# Patient Record
Sex: Male | Born: 1965 | Race: Black or African American | Hispanic: No | Smoking: Former smoker
Health system: Southern US, Community
[De-identification: ages and names within clinical notes are randomized; demographics above are authoritative.]

## PROBLEM LIST (undated history)

## (undated) DIAGNOSIS — G709 Myoneural disorder, unspecified: Secondary | ICD-10-CM

## (undated) DIAGNOSIS — L97529 Non-pressure chronic ulcer of other part of left foot with unspecified severity: Secondary | ICD-10-CM

## (undated) DIAGNOSIS — E11621 Type 2 diabetes mellitus with foot ulcer: Secondary | ICD-10-CM

## (undated) DIAGNOSIS — I10 Essential (primary) hypertension: Secondary | ICD-10-CM

## (undated) HISTORY — DX: Type 2 diabetes mellitus with foot ulcer: E11.621

## (undated) HISTORY — PX: TONSILLECTOMY: SUR1361

## (undated) HISTORY — DX: Essential (primary) hypertension: I10

## (undated) HISTORY — PX: HAND SURGERY: SHX662

## (undated) HISTORY — DX: Non-pressure chronic ulcer of other part of left foot with unspecified severity: L97.529

## (undated) HISTORY — DX: Myoneural disorder, unspecified: G70.9

---

## 2017-05-04 ENCOUNTER — Emergency Department (INDEPENDENT_AMBULATORY_CARE_PROVIDER_SITE_OTHER): Payer: 59

## 2017-05-04 ENCOUNTER — Encounter: Payer: Self-pay | Admitting: Podiatry

## 2017-05-04 ENCOUNTER — Telehealth: Payer: Self-pay | Admitting: Sports Medicine

## 2017-05-04 ENCOUNTER — Ambulatory Visit (INDEPENDENT_AMBULATORY_CARE_PROVIDER_SITE_OTHER): Payer: 59 | Admitting: Podiatry

## 2017-05-04 ENCOUNTER — Emergency Department
Admission: EM | Admit: 2017-05-04 | Discharge: 2017-05-04 | Disposition: A | Discharge status: Home / Self Care | Payer: 59 | Source: Home / Self Care | Attending: Family Medicine | Admitting: Family Medicine

## 2017-05-04 ENCOUNTER — Encounter: Payer: Self-pay | Admitting: *Deleted

## 2017-05-04 ENCOUNTER — Ambulatory Visit (INDEPENDENT_AMBULATORY_CARE_PROVIDER_SITE_OTHER): Payer: 59

## 2017-05-04 VITALS — BP 155/71 | HR 82 | Ht 72.0 in | Wt 270.0 lb

## 2017-05-04 DIAGNOSIS — L97509 Non-pressure chronic ulcer of other part of unspecified foot with unspecified severity: Secondary | ICD-10-CM

## 2017-05-04 DIAGNOSIS — L02612 Cutaneous abscess of left foot: Secondary | ICD-10-CM

## 2017-05-04 DIAGNOSIS — L03032 Cellulitis of left toe: Secondary | ICD-10-CM | POA: Diagnosis not present

## 2017-05-04 DIAGNOSIS — E11621 Type 2 diabetes mellitus with foot ulcer: Secondary | ICD-10-CM | POA: Diagnosis not present

## 2017-05-04 DIAGNOSIS — R739 Hyperglycemia, unspecified: Secondary | ICD-10-CM

## 2017-05-04 DIAGNOSIS — S91132A Puncture wound without foreign body of left great toe without damage to nail, initial encounter: Secondary | ICD-10-CM | POA: Diagnosis not present

## 2017-05-04 DIAGNOSIS — M79675 Pain in left toe(s): Secondary | ICD-10-CM

## 2017-05-04 DIAGNOSIS — L03116 Cellulitis of left lower limb: Secondary | ICD-10-CM

## 2017-05-04 DIAGNOSIS — L97529 Non-pressure chronic ulcer of other part of left foot with unspecified severity: Secondary | ICD-10-CM | POA: Diagnosis not present

## 2017-05-04 DIAGNOSIS — L97519 Non-pressure chronic ulcer of other part of right foot with unspecified severity: Secondary | ICD-10-CM | POA: Diagnosis not present

## 2017-05-04 LAB — POCT CBC W AUTO DIFF (K'VILLE URGENT CARE)

## 2017-05-04 LAB — POCT FASTING CBG KUC MANUAL ENTRY: POCT GLUCOSE (MANUAL ENTRY) KUC: 443 mg/dL — AB (ref 70–99)

## 2017-05-04 LAB — SEDIMENTATION RATE: SED RATE: 118 mm/h — AB (ref 0–20)

## 2017-05-04 MED ORDER — KETOROLAC TROMETHAMINE 60 MG/2ML IJ SOLN
60.0000 mg | Freq: Once | INTRAMUSCULAR | Status: AC
  Administered 2017-05-04: 60 mg via INTRAMUSCULAR

## 2017-05-04 MED ORDER — AMOXICILLIN-POT CLAVULANATE 875-125 MG PO TABS: 1.0000 | ORAL_TABLET | Freq: Two times a day (BID) | ORAL | 1 refills | Status: DC

## 2017-05-04 NOTE — Assessment & Plan Note (Signed)
Toe ulcer for sometime, plantar aspect, I able to probe all the way to the bone making this consistent with osteomyelitis. He has no history of diabetes but a spot blood sugar today was over 400. MRI of the foot, referral to podiatry, I do suspect he is going to need a medication. He also needs to find a primary care doctor for control of his diabetes.

## 2017-05-04 NOTE — Telephone Encounter (Signed)
After telling the patient multiple times to answer his phone when I call, I was forced to leave message letting him know that I have made him appointments with our podiatrist downstairs at [address], Dr. Raynald KempSheard. He also has an MRI appointment at 4:30 also downstairs.  During my evaluation he agreed to establish with one of our primary care providers for further control of his newly diagnosed diabetes.

## 2017-05-04 NOTE — ED Triage Notes (Signed)
Pt c/o infected LT great toe x 1 wk. Denies fever; he has felt "hot" at times.

## 2017-05-04 NOTE — Progress Notes (Signed)
SUBJECTIVE: 51 y.o. year old male presents referred by PCP for infected left great toe. Stated that he noted pain in the big toe on 04/30/17. Since then he noted a hole on bottom of the toe and more pain and swelling each day. He was seen by Dr. Cathren HarshBeese this morning and was referred to this office this afternoon. He is scheduled to have MRI of the foot  After this office visit today.  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  He was seen by Dr. Cathren HarshBeese prior to coming to this office. It was noted that this morning Blood glucose was 443  this morning at Dr. Cathren HarshBeese office.  Stated that the last time he was seen by a physician was about 2 years ago. He did not know he was diabetic.   REVIEW OF SYSTEMS: Pertinent items noted in HPI and remainder of comprehensive ROS otherwise negative.  OBJECTIVE: DERMATOLOGIC EXAMINATION: Red swollen entire left great toe limited distally to the metatarsophalangeal joint.  Noted of no proximal expansion of cellulitis beyond the first MPJ.  The lesion is about.8cm diameter round opening with deep penetration with serosanguinous wet red granular base and minimum drainage under the left great toe just distal to IPJ sulcus.  Base of opening is red without necrotic change.  VASCULAR EXAMINATION OF LOWER LIMBS: All pedal pulses are palpable with normal pulsation.  Edema left foot with erythema distal to the first MPJ left great toe.  NEUROLOGIC EXAMINATION OF THE LOWER LIMBS: All epicritic and tactile sensations grossly intact. Sharp and Dull discriminatory sensations at the plantar ball of hallux is intact bilateral.   MUSCULOSKELETAL EXAMINATION: No gross deformities noted.  ASSESSMENT: 1. Possible Deep puncture wound plantar left great toe with 0.8 cm opening. 2. Cellulitis localized to the left great toe. 3. Rule out Osteomyelitis left great toe.     The plantar opening of the wound may allow fluid to  drain and prevent further proximal advancement of Cellulitis.  4. Uncontrolled diabetic. 5. Possible Diabetic Neuropathy.   PLAN: Reviewed findings and available treatment options. While waiting on MRI report will put on Augmentin 875 bid. Will order antibiotic foot soak with [facility]. Will see him next week.

## 2017-05-04 NOTE — Consult Note (Addendum)
   Subjective:    I'm seeing this patient as a consultation for:  Dr. Donna ChristenStephen Beese  CC: Toe ulcer  HPI: This is a pleasant 51 year old male, describes no past medical history, he tells me for the past couple of weeks he's noticed some swelling of his toe, he looked underneath and found an ulcer. He really didn't feel any pain. Symptoms are severe, worsening.  Past medical history:  Negative.  See flowsheet/record as well for more information.  Surgical history: Negative.  See flowsheet/record as well for more information.  Family history: Negative.  See flowsheet/record as well for more information.  Social history: Negative.  See flowsheet/record as well for more information.  Allergies, and medications have been entered into the medical record, reviewed, and no changes needed.   Review of Systems: No headache, visual changes, nausea, vomiting, diarrhea, constipation, dizziness, abdominal pain, skin rash, fevers, chills, night sweats, weight loss, swollen lymph nodes, body aches, joint swelling, muscle aches, chest pain, shortness of breath, mood changes, visual or auditory hallucinations.   Objective:   General: Well Developed, well nourished, and in no acute distress.  Neuro/Psych: Alert and oriented x3, extra-ocular muscles intact, able to move all 4 extremities, sensation grossly intact. Skin: Warm and dry, no rashes noted.  Respiratory: Not using accessory muscles, speaking in full sentences, trachea midline.  Cardiovascular: Pulses palpable, no extremity edema. Abdomen: Does not appear distended. Left great toe:  Swollen, erythematous, there is what appears to be diabetic foot ulcer, I'm able to probe it all the way to the bone.  Blood sugar is over 400.  X-ray show degenerative changes but no evidence of osteomyelitis.  Impression and Recommendations:   This case required medical decision making of moderate complexity.  Diabetic foot ulcer (HCC) Toe ulcer for sometime,  plantar aspect, I able to probe all the way to the bone making this consistent with osteomyelitis. He has no history of diabetes but a spot blood sugar today was over 400. MRI of the foot, referral to podiatry, I do suspect he is going to need a medication. He also needs to find a primary care doctor for control of his diabetes.

## 2017-05-04 NOTE — Patient Instructions (Addendum)
Seen for acute infected ulcer left great toe. Ordered Augmentin 875 to take one every 12 hours. Also will order antibiotic foot soak daily treatment. Return in one week.

## 2017-05-04 NOTE — ED Provider Notes (Signed)
[name]    CSN: [id number] Arrival date & time: 05/04/17  0954     History   Chief Complaint Chief Complaint  Patient presents with  . Toe Pain  . Wound Infection    HPI Russell Merritt is a 51 y.o. male.   Patient noticed a "hole" on the plantar surface of his left great toe one week ago, and does not know how long it had been present.  During the past week he has developed gradually increasing redness, heat, and pain/swelling in his left great toe, especially worse during the past 48 hours.  He has felt increasingly fatigued with chills/sweats and decreased appetite.  He complains of pain in his proximal left foot which does not extend to his left lower leg.  He denies history of diabetes.   The history is provided by the patient.  Toe Pain  This is a new problem. The current episode started more than 1 week ago. The problem occurs constantly. The problem has been rapidly worsening. Pertinent negatives include no chest pain and no shortness of breath. The symptoms are aggravated by walking. Nothing relieves the symptoms. Treatments tried: topical ointment. The treatment provided no relief.    History reviewed. No pertinent past medical history.  Patient Active Problem List   Diagnosis Date Noted  . Diabetic foot ulcer (HCC) 05/04/2017    Past Surgical History:  Procedure Laterality Date  . HAND SURGERY Left   . TONSILLECTOMY         Home Medications    Prior to Admission medications   Medication Sig Start Date End Date Taking? Authorizing Provider  ibuprofen (ADVIL,MOTRIN) 200 MG tablet Take 200 mg by mouth every 6 (six) hours as needed.   Yes [provider]  MULTIPLE VITAMIN PO Take by mouth.   Yes [provider]    Family History Family History  Problem Relation Age of Onset  . Family history unknown: Yes    Social History Social History  Substance Use Topics  . Smoking status: Former Games developer  . Smokeless tobacco:  Never Used  . Alcohol use No     Allergies   Patient has no known allergies.   Review of Systems Review of Systems  Respiratory: Negative for shortness of breath.   Cardiovascular: Negative for chest pain.     Physical Exam Triage Vital Signs ED Triage Vitals  Enc Vitals Group     BP 05/04/17 1023 (!) 145/80     Pulse Rate 05/04/17 1023 86     Resp 05/04/17 1023 (!) 24     Temp 05/04/17 1023 99.1 F (37.3 C)     Temp Source 05/04/17 1023 Oral     SpO2 05/04/17 1023 96 %     Weight 05/04/17 1024 269 lb (122 kg)     Height 05/04/17 1024 6' (1.829 m)     Head Circumference --      Peak Flow --      Pain Score 05/04/17 1024 10     Pain Loc --      Pain Edu? --      Excl. in GC? --    No data found.   Updated Vital Signs BP (!) 145/80 (BP Location: Left Arm)   Pulse 86   Temp 99.1 F (37.3 C) (Oral)   Resp (!) 24   Ht 6' (1.829 m)   Wt 269 lb (122 kg)   SpO2 96%   BMI 36.48 kg/m  Visual Acuity Right Eye Distance:   Left Eye Distance:   Bilateral Distance:    Right Eye Near:   Left Eye Near:    Bilateral Near:     Physical Exam  Constitutional: He appears well-developed and well-nourished. No distress.  HENT:  Head: Normocephalic.  Right Ear: External ear normal.  Left Ear: External ear normal.  Nose: Nose normal.  Mouth/Throat: Oropharynx is clear and moist.  Eyes: Conjunctivae are normal. Pupils are equal, round, and reactive to light.  Neck: Neck supple.  Cardiovascular: Normal heart sounds.   Pulmonary/Chest: Breath sounds normal.  Musculoskeletal: He exhibits no edema.       Feet:  Left great toe is diffusely swollen, tender to palpation, erythematous, and warm to touch.  Cap refill present.  On the plantar surface of the first toe is a chronic appearing ulcer.  Tenderness and warmth extent to proximal foot, but not above ankle.  Pedal pulses are intact.  Lymphadenopathy:    He has no cervical adenopathy.  Neurological: He is alert.    Skin: Skin is warm and dry.  Nursing note and vitals reviewed.    UC Treatments / Results  Labs (all labs ordered are listed, but only abnormal results are displayed) Labs Reviewed  POCT FASTING CBG [facility] MANUAL ENTRY - Abnormal; Notable for the following:       Result Value   POCT Glucose ([facility]) 443 (*)    All other components within normal limits  SEDIMENTATION RATE  POCT CBC W AUTO DIFF ([facility])    EKG  EKG Interpretation None       Radiology Dg Foot Complete Left  Result Date: 05/04/2017 CLINICAL DATA:  Drainage, pain, and swelling associated with a defect in the skin along the ventral aspect of the first toe. EXAM: LEFT FOOT - COMPLETE 3+ VIEW COMPARISON:  None in PACs FINDINGS: There is diffuse swelling of the great toe. There is soft tissue gas both proximally and distally in the toe. None extends into the metatarsal region. No definite bony changes are seen suspicious for osteomyelitis. There is no acute fracture. There is mild degenerative change of the first metatarsophalangeal joint. The other phalanges appear normal. The metatarsals are intact. There are mild degenerative changes of the intertarsal joints. There are plantar and Achilles region calcaneal spurs. There is calcification within the plantar fascia. IMPRESSION: Findings consistent with cellulitis of the great toe with soft tissue gas collections. No objective evidence of osteomyelitis. 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 Electronically Signed   By: David  SwazilandJordan M.D.   On: 05/04/2017 11:43    Procedures Procedures (including critical care time)  Medications Ordered in UC Medications  ketorolac (TORADOL) injection 60 mg (60 mg Intramuscular Given 05/04/17 1115)  POCT CBG 443   Initial Impression / Assessment and Plan / UC Course  I have reviewed the triage vital signs and the nursing notes.  Pertinent labs & imaging results that were available during my care of the patient were  reviewed by me and considered in my medical decision making (see chart for details).    Suspect osteomyelitis.  Suspect peripheral neuropathy resulting from undiagnosed Type 2 diabetes. Will refer to Dr. Rodney Langtonhomas Thekkekandam for management and further evaluation.  Final Clinical Impressions(s) / UC Diagnoses   Final diagnoses:  Cellulitis of great toe, left  Cellulitis of left foot  Hyperglycemia    New Prescriptions Discharge Medication List as of 05/04/2017 12:30 PM       Geet Hosking,  Tera Mater, MD 05/04/17 [phone]

## 2017-05-05 ENCOUNTER — Inpatient Hospital Stay (HOSPITAL_COMMUNITY)
Admission: EM | Admit: 2017-05-05 | Discharge: 2017-05-09 | DRG: 616 | Disposition: A | Discharge status: Home Health | Payer: 59 | Attending: Internal Medicine | Admitting: Internal Medicine

## 2017-05-05 ENCOUNTER — Ambulatory Visit (INDEPENDENT_AMBULATORY_CARE_PROVIDER_SITE_OTHER): Payer: 59 | Admitting: Podiatry

## 2017-05-05 ENCOUNTER — Encounter (HOSPITAL_COMMUNITY): Payer: Self-pay | Admitting: Emergency Medicine

## 2017-05-05 DIAGNOSIS — L97529 Non-pressure chronic ulcer of other part of left foot with unspecified severity: Secondary | ICD-10-CM | POA: Diagnosis present

## 2017-05-05 DIAGNOSIS — L02612 Cutaneous abscess of left foot: Secondary | ICD-10-CM | POA: Diagnosis present

## 2017-05-05 DIAGNOSIS — L03032 Cellulitis of left toe: Secondary | ICD-10-CM

## 2017-05-05 DIAGNOSIS — M869 Osteomyelitis, unspecified: Secondary | ICD-10-CM | POA: Diagnosis present

## 2017-05-05 DIAGNOSIS — A480 Gas gangrene: Secondary | ICD-10-CM | POA: Diagnosis present

## 2017-05-05 DIAGNOSIS — L97509 Non-pressure chronic ulcer of other part of unspecified foot with unspecified severity: Secondary | ICD-10-CM

## 2017-05-05 DIAGNOSIS — E861 Hypovolemia: Secondary | ICD-10-CM | POA: Diagnosis present

## 2017-05-05 DIAGNOSIS — E1140 Type 2 diabetes mellitus with diabetic neuropathy, unspecified: Secondary | ICD-10-CM | POA: Diagnosis present

## 2017-05-05 DIAGNOSIS — IMO0002 Reserved for concepts with insufficient information to code with codable children: Secondary | ICD-10-CM

## 2017-05-05 DIAGNOSIS — E119 Type 2 diabetes mellitus without complications: Secondary | ICD-10-CM

## 2017-05-05 DIAGNOSIS — E11628 Type 2 diabetes mellitus with other skin complications: Secondary | ICD-10-CM | POA: Diagnosis present

## 2017-05-05 DIAGNOSIS — E11621 Type 2 diabetes mellitus with foot ulcer: Secondary | ICD-10-CM | POA: Diagnosis present

## 2017-05-05 DIAGNOSIS — E1169 Type 2 diabetes mellitus with other specified complication: Secondary | ICD-10-CM | POA: Diagnosis present

## 2017-05-05 DIAGNOSIS — M86172 Other acute osteomyelitis, left ankle and foot: Secondary | ICD-10-CM

## 2017-05-05 DIAGNOSIS — L03119 Cellulitis of unspecified part of limb: Secondary | ICD-10-CM

## 2017-05-05 DIAGNOSIS — E118 Type 2 diabetes mellitus with unspecified complications: Secondary | ICD-10-CM

## 2017-05-05 DIAGNOSIS — M86272 Subacute osteomyelitis, left ankle and foot: Secondary | ICD-10-CM | POA: Diagnosis not present

## 2017-05-05 DIAGNOSIS — E669 Obesity, unspecified: Secondary | ICD-10-CM | POA: Diagnosis present

## 2017-05-05 DIAGNOSIS — E1165 Type 2 diabetes mellitus with hyperglycemia: Secondary | ICD-10-CM | POA: Diagnosis present

## 2017-05-05 DIAGNOSIS — Z87891 Personal history of nicotine dependence: Secondary | ICD-10-CM | POA: Diagnosis not present

## 2017-05-05 DIAGNOSIS — Z6836 Body mass index (BMI) 36.0-36.9, adult: Secondary | ICD-10-CM | POA: Diagnosis not present

## 2017-05-05 DIAGNOSIS — E871 Hypo-osmolality and hyponatremia: Secondary | ICD-10-CM | POA: Diagnosis present

## 2017-05-05 LAB — GLUCOSE, CAPILLARY: GLUCOSE-CAPILLARY: 214 mg/dL — AB (ref 65–99)

## 2017-05-05 LAB — CBC WITH DIFFERENTIAL/PLATELET
Basophils Absolute: 0 10*3/uL (ref 0.0–0.1)
Basophils Relative: 0 %
EOS ABS: 0.2 10*3/uL (ref 0.0–0.7)
Eosinophils Relative: 1 %
HCT: 40.3 % (ref 39.0–52.0)
HEMOGLOBIN: 14.4 g/dL (ref 13.0–17.0)
LYMPHS PCT: 20 %
Lymphs Abs: 3.4 10*3/uL (ref 0.7–4.0)
MCH: 31 pg (ref 26.0–34.0)
MCHC: 35.7 g/dL (ref 30.0–36.0)
MCV: 86.9 fL (ref 78.0–100.0)
MONO ABS: 1 10*3/uL (ref 0.1–1.0)
Monocytes Relative: 6 %
NEUTROS PCT: 73 %
Neutro Abs: 12.5 10*3/uL — ABNORMAL HIGH (ref 1.7–7.7)
PLATELETS: 285 10*3/uL (ref 150–400)
RBC: 4.64 MIL/uL (ref 4.22–5.81)
RDW: 12.7 % (ref 11.5–15.5)
WBC: 17.1 10*3/uL — AB (ref 4.0–10.5)

## 2017-05-05 LAB — BASIC METABOLIC PANEL
ANION GAP: 10 (ref 5–15)
BUN: 18 mg/dL (ref 6–20)
CALCIUM: 8.7 mg/dL — AB (ref 8.9–10.3)
CHLORIDE: 91 mmol/L — AB (ref 101–111)
CO2: 27 mmol/L (ref 22–32)
Creatinine, Ser: 0.94 mg/dL (ref 0.61–1.24)
GFR calc non Af Amer: >60 mL/min (ref >60)
Glucose, Bld: 356 mg/dL — ABNORMAL HIGH (ref 65–99)
Potassium: 4.1 mmol/L (ref 3.5–5.1)
SODIUM: 128 mmol/L — AB (ref 135–145)

## 2017-05-05 LAB — CBG MONITORING, ED: GLUCOSE-CAPILLARY: 312 mg/dL — AB (ref 65–99)

## 2017-05-05 LAB — LACTIC ACID, PLASMA: Lactic Acid, Venous: 1.1 mmol/L (ref 0.5–1.9)

## 2017-05-05 MED ORDER — ACETAMINOPHEN 325 MG PO TABS: 650.0000 mg | ORAL_TABLET | Freq: Four times a day (QID) | ORAL | Status: DC | PRN

## 2017-05-05 MED ORDER — VANCOMYCIN HCL 10 G IV SOLR
2000.0000 mg | Freq: Once | INTRAVENOUS | Status: AC
  Administered 2017-05-05: 2000 mg via INTRAVENOUS
  Filled 2017-05-05: qty 2000

## 2017-05-05 MED ORDER — VANCOMYCIN HCL 10 G IV SOLR
2250.0000 mg | Freq: Once | INTRAVENOUS | Status: DC
  Filled 2017-05-05: qty 2250

## 2017-05-05 MED ORDER — INSULIN ASPART 100 UNIT/ML ~~LOC~~ SOLN
0.0000 [IU] | Freq: Every day | SUBCUTANEOUS | Status: DC
  Administered 2017-05-05: 2 [IU] via SUBCUTANEOUS
  Administered 2017-05-06: 3 [IU] via SUBCUTANEOUS
  Administered 2017-05-07 – 2017-05-08 (×2): 2 [IU] via SUBCUTANEOUS

## 2017-05-05 MED ORDER — VANCOMYCIN HCL 10 G IV SOLR
1250.0000 mg | Freq: Two times a day (BID) | INTRAVENOUS | Status: DC
  Administered 2017-05-06 – 2017-05-07 (×4): 1250 mg via INTRAVENOUS
  Filled 2017-05-05 (×6): qty 1250

## 2017-05-05 MED ORDER — SODIUM CHLORIDE 0.9 % IV SOLN
INTRAVENOUS | Status: AC
  Administered 2017-05-05 – 2017-05-06 (×2): via INTRAVENOUS

## 2017-05-05 MED ORDER — ONDANSETRON HCL 4 MG/2ML IJ SOLN
4.0000 mg | Freq: Four times a day (QID) | INTRAMUSCULAR | Status: DC | PRN
  Administered 2017-05-06: 4 mg via INTRAVENOUS

## 2017-05-05 MED ORDER — ONDANSETRON HCL 4 MG PO TABS: 4.0000 mg | ORAL_TABLET | Freq: Four times a day (QID) | ORAL | Status: DC | PRN

## 2017-05-05 MED ORDER — HYDROCODONE-ACETAMINOPHEN 5-325 MG PO TABS
1.0000 | ORAL_TABLET | Freq: Four times a day (QID) | ORAL | Status: DC | PRN
  Administered 2017-05-06 (×2): 1 via ORAL
  Administered 2017-05-07 – 2017-05-09 (×8): 2 via ORAL
  Filled 2017-05-05: qty 2
  Filled 2017-05-05: qty 1
  Filled 2017-05-05 (×8): qty 2
  Filled 2017-05-05: qty 1

## 2017-05-05 MED ORDER — METRONIDAZOLE IN NACL 5-0.79 MG/ML-% IV SOLN
500.0000 mg | Freq: Three times a day (TID) | INTRAVENOUS | Status: DC
  Administered 2017-05-05 – 2017-05-09 (×11): 500 mg via INTRAVENOUS
  Filled 2017-05-05 (×14): qty 100

## 2017-05-05 MED ORDER — HYDROMORPHONE HCL 1 MG/ML IJ SOLN
1.0000 mg | Freq: Once | INTRAMUSCULAR | Status: AC
  Administered 2017-05-05: 1 mg via INTRAVENOUS
  Filled 2017-05-05: qty 1

## 2017-05-05 MED ORDER — INSULIN ASPART 100 UNIT/ML ~~LOC~~ SOLN
0.0000 [IU] | Freq: Three times a day (TID) | SUBCUTANEOUS | Status: DC
  Administered 2017-05-06: 13:00:00 5 [IU] via SUBCUTANEOUS

## 2017-05-05 MED ORDER — MORPHINE SULFATE (PF) 2 MG/ML IV SOLN
2.0000 mg | INTRAVENOUS | Status: DC | PRN
  Administered 2017-05-05: 2 mg via INTRAVENOUS
  Filled 2017-05-05: qty 1

## 2017-05-05 MED ORDER — SODIUM CHLORIDE 0.9 % IV BOLUS (SEPSIS)
1000.0000 mL | Freq: Once | INTRAVENOUS | Status: AC
  Administered 2017-05-05: 1000 mL via INTRAVENOUS

## 2017-05-05 NOTE — Progress Notes (Signed)
Subjective: Called patient to come in to discuss MRI result, which indicated bone infection and need for a timely intervention. Patient came in stating that he is feeling more pain and discomfort through the affected left great toe.  Objective: Noted of expanding and ascending cellulitis to dorsum, plantar and medial aspect of the left mid foot level. More redness on the toe and new necrotic outer skin layer dorsum of left great toe. Minimum drainage and more generalized edema noted.  New Assessment: Osteomyelitis with acute ascending cellulitis left great toe. Deep open wound left great toe.  Plan: Reviewed findings and need for immediate IV antibiotic therapy and possible hospitalization. Patient understood the nature of urgency. Patient and driver [facility] as advised.

## 2017-05-05 NOTE — ED Triage Notes (Signed)
Patient was sent from podiatrist due to she said oral antibiotics was not going to be enough. The podiatrist tried to cleaned it up and he went back and she said that it looks worst. She wanted him to come to the ED.

## 2017-05-05 NOTE — H&P (Signed)
History and Physical    HONOR FA[employer]BANK JWJ:[id number] DOB: 1966/01/12 DOA: 05/05/2017  PCP: Patient, No Pcp Per   Patient coming from: Home, by way of podiatry clinic  Chief Complaint: Left great toe pain, swelling, discoloration, and drainage; fevers, malaise  HPI: Russell Merritt is a 51 y.o. male who denies any significant past medical history, while acknowledging that he has not seen a physician in many years, now presenting to the emergency department at the direction of his podiatrist for evaluation of pain, swelling, discoloration, and drainage from his left great toe, as well as fevers and nonspecific malaise. Patient reports that he had been in his usual state of health until 8 days ago when he noted some pain and redness involving the left first toe. He noted an ulcer at the plantar aspect of the toe at that time and reports cleaning it with peroxide and applying topical antibiotic ointment. Over the ensuing days, redness and pain increased and swelling developed. 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 He was started on Augmentin, but by that time he had already developed fevers and general malaise. His condition continued to worsen with persistent fevers and chills and lethargy. He was reevaluated by podiatry today, noted to have worsened, and directed to the ED. Admits this workup, the patient was also noted to have a random glucose greater than 400.  ED Course: Upon arrival to the ED, patient is found to be febrile to 38 C, saturating well on room air, and with vitals otherwise stable. Chemistry panels notable for serum glucose 356 and sodium of 128. CBC is notable for a leukocytosis to 17,100. Patient was treated with 1 L of normal saline, Dilaudid, and vancomycin in the emergency department. Orthopedic surgery was consulted  by the ED physician and plans to evaluate the patient. Patient remained hemodynamically stable in the ED and has not been in any respiratory distress. 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   Review of Systems:  All other systems reviewed and apart from HPI, are negative.  History reviewed. No pertinent past medical history.  Past Surgical History:  Procedure Laterality Date  . HAND SURGERY Left   . TONSILLECTOMY       reports that he has quit smoking. He has never used smokeless tobacco. He reports that he does not drink alcohol or use drugs.  No Known Allergies  Family History  Problem Relation Age of Onset  . Family history unknown: Yes     Prior to Admission medications   Medication Sig Start Date End Date Taking? Authorizing Provider  amoxicillin-clavulanate (AUGMENTIN) 875-125 MG tablet Take 1 tablet by mouth 2 (two) times daily. 05/04/17  Yes Sheard, Myeong O, DPM  ibuprofen (ADVIL,MOTRIN) 200 MG tablet Take 800 mg by mouth every 8 (eight) hours as needed for moderate pain.    Yes [provider]  MULTIPLE VITAMIN PO Take 1 tablet by mouth daily.    Yes [provider]  clindamycin (CLEOCIN T) 1 % external solution Apply 1 application topically 2 (two) times daily.  05/05/17   [provider]    Physical Exam: Vitals:   05/05/17 1711 05/05/17 2001 05/05/17 2119 05/05/17 2259  BP:  Marland Kitchen)  181/91 (!) 165/84 (!) 166/74  Pulse:  87 84 77  Resp:  18 20 18   Temp:   (!) 100.4 F (38 C) (!) 100.6 F (38.1 C)  TempSrc:   Oral Oral  SpO2:  99% 96% 93%  Weight: 122.5 kg (270 lb)     Height: 6' (1.829 m)         Constitutional: NAD, calm, obese, in apparent discomfort.  Eyes: PERTLA, lids and conjunctivae normal ENMT: Mucous membranes are moist. Posterior pharynx clear of any exudate or lesions.   Neck: normal,  supple, no masses, no thyromegaly Respiratory: clear to auscultation bilaterally, no wheezing, no crackles. Normal respiratory effort.   Cardiovascular: S1 & S2 heard, regular rate and rhythm, soft systolic murmur at RUSB. No significant JVD. Abdomen: No distension, no tenderness, no masses palpated. Bowel sounds normal.  Musculoskeletal: no clubbing / cyanosis. Left great toe swollen, cyanotic, exquisitely tender. Left ring finger surgically absent.   Skin: Left 1st toe findings as above, also with erythema, warmth, and edema extending over dorsal forefoot. Skin is otherwise warm, dry, well-perfused. Neurologic: CN 2-12 grossly intact. Sensation intact, DTR normal. Strength 5/5 in all 4 limbs.  Psychiatric:  Alert and oriented x 3. Pleasant and cooperative.     Labs on Admission: I have personally reviewed following labs and imaging studies  CBC:  Recent Labs Lab 05/05/17 2041  WBC 17.1*  NEUTROABS 12.5*  HGB 14.4  HCT 40.3  MCV 86.9  PLT 285   Basic Metabolic Panel:  Recent Labs Lab 05/05/17 2041  NA 128*  K 4.1  CL 91*  CO2 27  GLUCOSE 356*  BUN 18  CREATININE 0.94  CALCIUM 8.7*   GFR: Estimated Creatinine Clearance: 125.7 mL/min (by C-G formula based on SCr of 0.94 mg/dL). Liver Function Tests: No results for input(s): AST, ALT, ALKPHOS, BILITOT, PROT, ALBUMIN in the last 168 hours. No results for input(s): LIPASE, AMYLASE in the last 168 hours. No results for input(s): AMMONIA in the last 168 hours. Coagulation Profile: No results for input(s): INR, PROTIME in the last 168 hours. Cardiac Enzymes: No results for input(s): CKTOTAL, CKMB, CKMBINDEX, TROPONINI in the last 168 hours. BNP (last 3 results) No results for input(s): PROBNP in the last 8760 hours. HbA1C: No results for input(s): HGBA1C in the last 72 hours. CBG:  Recent Labs Lab 05/05/17 2118  GLUCAP 312*   Lipid Profile: No results for input(s): CHOL, HDL, LDLCALC, TRIG, CHOLHDL, LDLDIRECT in  the last 72 hours. Thyroid Function Tests: No results for input(s): TSH, T4TOTAL, FREET4, T3FREE, THYROIDAB in the last 72 hours. Anemia Panel: No results for input(s): VITAMINB12, FOLATE, FERRITIN, TIBC, IRON, RETICCTPCT in the last 72 hours. Urine analysis: No results found for: COLORURINE, APPEARANCEUR, LABSPEC, PHURINE, GLUCOSEU, HGBUR, BILIRUBINUR, KETONESUR, PROTEINUR, UROBILINOGEN, NITRITE, LEUKOCYTESUR Sepsis Labs: @LABRCNTIP (procalcitonin:4,lacticidven:4) )No results found for this or any previous visit (from the past 240 hour(s)).   Radiological Exams on Admission: Mr Toes Left Wo Contrast  Result Date: 05/04/2017 CLINICAL DATA:  Diabetic ulcer of the great toe. Pain. Swelling and redness. EXAM: MRI OF THE LEFT TOES WITHOUT CONTRAST TECHNIQUE: Multiplanar, multisequence MR imaging of the left toes was performed. No intravenous contrast was administered. COMPARISON:  Radiographs dated 05/04/2017 FINDINGS: Bones/Joint/Cartilage There is osteomyelitis of the proximal and distal phalanges of the great toe with gas with in the bones. There is also gas in the adjacent soft tissues with dorsal and ventral to the bones. 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 Soft tissue  ulceration noted on the plantar aspect of great toe. No discrete abscess Moderate arthritis of the first MTP joint. The bones of the other toes are normal. IMPRESSION: 1. Gas gangrene of the great toe. 2. Osteomyelitis of the proximal and distal phalanges of the great toe with gas within the bones. Electronically Signed   By: Francene BoyersJames  Maxwell M.D.   On: 05/04/2017 17:17   Dg Foot Complete Left  Result Date: 05/04/2017 CLINICAL DATA:  Drainage, pain, and swelling associated with a defect in the skin along the ventral aspect of the first toe. EXAM: LEFT FOOT - COMPLETE 3+ VIEW COMPARISON:  None in PACs FINDINGS: There is diffuse swelling of the great toe. There is soft tissue gas both proximally and distally in the toe. None extends  into the metatarsal region. No definite bony changes are seen suspicious for osteomyelitis. There is no acute fracture. There is mild degenerative change of the first metatarsophalangeal joint. The other phalanges appear normal. The metatarsals are intact. There are mild degenerative changes of the intertarsal joints. There are plantar and Achilles region calcaneal spurs. There is calcification within the plantar fascia. IMPRESSION: Findings consistent with cellulitis of the great toe with soft tissue gas collections. No objective evidence of osteomyelitis. 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 Electronically Signed   By: David  [place]azil[place]Jordan M.D.   On: 05/04/2017 11:43    EKG: Not performed.   Assessment/Plan  1. 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  - Pt developed pain at left great toe on 04/27/17 and noted an ulcer at the plantar aspect - Over the past week, pain, swelling, and discoloration of the toe has increased, spread to involve dorsal forefoot, and is now associated with fevers/chills and malaise  - MRI from 05/04/17 confirms gas gangrene with osteomyelitis involving distal and proximal 1st phalanges - He was started on Augmentin by podiatry yesterday - Empiric vancomycin administered in ED  - Orthopedic surgery is consulting and much appreciated; will keep pt NPO and follow-up on recommendations  - For now, plan to obtain cultures, continue empiric abx with vancomycin and Flagyl, provide supportive care with IVF, antipyretics, analgesia    2. Diabetes mellitus, new diagnosis  - Pt noted to have random CBG in 443 at urgent care day prior to admission  - Serum glucose is 356 on presentation here  - Plan to check A1c, follow CBG's with meals and qHS, start a moderate-intensity sliding-scale insulin, and request diabetic educator consultation  3. Hyponatremia  - Serum sodium is 128 on admission in setting of marked hyperglycemia and hypovolemia  - He was given a liter of  NS in ED, is continued on NS infusion, and will be started on a correctional insulin regimen as above  - Repeat chem panel in am   DVT prophylaxis: SCD's  Code Status: Full  Family Communication: Discussed with patient Disposition Plan: Admit to med-surg Consults called: Orthopedic surgery Admission status: Inpatient    Briscoe Deutscherimothy S Opyd, MD [facility] Pager [phone]  If 7PM-7AM, please contact night-coverage [email or web address] Password [email or web address]  05/05/2017, 11:15 PM

## 2017-05-05 NOTE — Progress Notes (Signed)
Pharmacy Antibiotic Note  Russell Merritt is a 51 y.o. male admitted on 05/05/2017 with osteomyelitis.  Pharmacy has been consulted for vancomycin dosing.  Pt sent to ED by podiatrist for osteomyelitis and diabetic foot infection in a patient with previously unknown diabetes.  Pt notes he found a hole in the plantar aspect of his left great toe 8 days ago.  He went to Urgent Care yesterday and had cbg 443.  Outpatient MRI demonstrated osteomyelitis.  Pt seen by podiatrist who advised going to ED.     Plan:  Vancomycin 2000 mg IV x1, then vancomycin 1250 mg IV q12h  Monitor clinical course, renal function, cultures as available  Height: 6' (182.9 cm) Weight: 270 lb (122.5 kg) IBW/kg (Calculated) : 77.6  Temp (24hrs), Avg:99.5 F (37.5 C), Min:98.5 F (36.9 C), Max:100.4 F (38 C)   Recent Labs Lab 05/05/17 2041  WBC 17.1*  CREATININE 0.94    Estimated Creatinine Clearance: 125.7 mL/min (by C-G formula based on SCr of 0.94 mg/dL).    No Known Allergies  Antimicrobials this admission:  6/5 vancomycin >>    Dose adjustments this admission: ---  Microbiology results: 6/5 wound Cx: sent   Thank you for allowing pharmacy to be a part of this patient's care.  Adalberto ColeNikola Jacalyn Biggs, PharmD, BCPS Pager [phone] 05/05/2017 9:46 PM

## 2017-05-05 NOTE — Patient Instructions (Signed)
Discussed MRI finding and worsening of the condition. Ad[facility] and be admitted for proper treatment.

## 2017-05-05 NOTE — ED Notes (Signed)
Gave report to MonticelloShanice, Charity fundraiserN for room [phone]. Hospitalist at bedside.

## 2017-05-05 NOTE — ED Provider Notes (Signed)
[facility]-EMERGENCY DEPT Provider Note   CSN: [id number] Arrival date & time: 05/05/17  1648     History   Chief Complaint Chief Complaint  Patient presents with  . Toe Pain    HPI Russell Merritt is a 51 y.o. male.  HPI   Pt sent to ED by podiatrist for osteomyelitis and diabetic foot infection in a patient with previously unknown diabetes.  Pt notes he found a hole in the plantar aspect of his left great toe 8 days ago.  He went to Urgent Care yesterday and had cbg 443.  Outpatient MRI demonstrated osteomyelitis.  Pt seen by podiatrist who advised going to ED.    Pt has had fatigue, chills/sweats, decreased appetite, and pain in the left foot gradually increasing over the week. Has not noticed discharge from the toe.     History reviewed. No pertinent past medical history.  Patient Active Problem List   Diagnosis Date Noted  . Cellulitis in diabetic foot (HCC) 05/05/2017  . Osteomyelitis (HCC) 05/05/2017  . Newly diagnosed diabetes (HCC) 05/05/2017  . Hyponatremia 05/05/2017  . Diabetic foot ulcer (HCC) 05/04/2017    Past Surgical History:  Procedure Laterality Date  . HAND SURGERY Left   . TONSILLECTOMY         Home Medications    Prior to Admission medications   Medication Sig Start Date End Date Taking? Authorizing Provider  amoxicillin-clavulanate (AUGMENTIN) 875-125 MG tablet Take 1 tablet by mouth 2 (two) times daily. 05/04/17  Yes Sheard, Myeong O, DPM  ibuprofen (ADVIL,MOTRIN) 200 MG tablet Take 800 mg by mouth every 8 (eight) hours as needed for moderate pain.    Yes [provider]  MULTIPLE VITAMIN PO Take 1 tablet by mouth daily.    Yes [provider]  clindamycin (CLEOCIN T) 1 % external solution Apply 1 application topically 2 (two) times daily.  05/05/17   [provider]    Family History Family History  Problem Relation Age of Onset  . Family history unknown: Yes    Social History Social History  Substance Use Topics   . Smoking status: Former Games developer  . Smokeless tobacco: Never Used  . Alcohol use No     Allergies   Patient has no known allergies.   Review of Systems Review of Systems  All other systems reviewed and are negative.    Physical Exam Updated Vital Signs BP (!) 165/84 (BP Location: Right Arm)   Pulse 84   Temp (!) 100.4 F (38 C) (Oral)   Resp 20   Ht 6' (1.829 m)   Wt 122.5 kg (270 lb)   SpO2 96%   BMI 36.62 kg/m   Physical Exam  Constitutional: He appears well-developed and well-nourished. No distress.  HENT:  Head: Normocephalic and atraumatic.  Neck: Neck supple.  Cardiovascular: Normal rate and regular rhythm.   Pulmonary/Chest: Effort normal and breath sounds normal. No respiratory distress. He has no wheezes. He has no rales.  Abdominal: Soft. He exhibits no distension and no mass. There is no tenderness. There is no rebound and no guarding.  Musculoskeletal:  Large deep ulcer plantar left great toe.  Edema, erythema, tenderness involving toe and forefoot.  Please see photo below.    Neurological: He is alert. He exhibits normal muscle tone.  Skin: He is not diaphoretic.  Nursing note and vitals reviewed.        ED Treatments / Results  Labs (all labs ordered are listed, but only abnormal  results are displayed) Labs Reviewed  BASIC METABOLIC PANEL - Abnormal; Notable for the following:       Result Value   Sodium 128 (*)    Chloride 91 (*)    Glucose, Bld 356 (*)    Calcium 8.7 (*)    All other components within normal limits  CBC WITH DIFFERENTIAL/PLATELET - Abnormal; Notable for the following:    WBC 17.1 (*)    Neutro Abs 12.5 (*)    All other components within normal limits  CBG MONITORING, ED - Abnormal; Notable for the following:    Glucose-Capillary 312 (*)    All other components within normal limits  AEROBIC CULTURE (SUPERFICIAL SPECIMEN)  CULTURE, BLOOD (ROUTINE X 2)  CULTURE, BLOOD (ROUTINE X 2)  BASIC METABOLIC PANEL  CBC WITH  DIFFERENTIAL/PLATELET  HEMOGLOBIN A1C  LACTIC ACID, PLASMA  LACTIC ACID, PLASMA  CBG MONITORING, ED    EKG  EKG Interpretation None       Radiology Mr Toes Left Wo Contrast  Result Date: 05/04/2017 CLINICAL DATA:  Diabetic ulcer of the great toe. Pain. Swelling and redness. EXAM: MRI OF THE LEFT TOES WITHOUT CONTRAST TECHNIQUE: Multiplanar, multisequence MR imaging of the left toes was performed. No intravenous contrast was administered. COMPARISON:  Radiographs dated 05/04/2017 FINDINGS: Bones/Joint/Cartilage There is osteomyelitis of the proximal and distal phalanges of the great toe with gas with in the bones. There is also gas in the adjacent soft tissues with dorsal and ventral to the bones. 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 Soft tissue ulceration noted on the plantar aspect of great toe. No discrete abscess Moderate arthritis of the first MTP joint. The bones of the other toes are normal. IMPRESSION: 1. Gas gangrene of the great toe. 2. Osteomyelitis of the proximal and distal phalanges of the great toe with gas within the bones. Electronically Signed   By: Francene Boyers M.D.   On: 05/04/2017 17:17   Dg Foot Complete Left  Result Date: 05/04/2017 CLINICAL DATA:  Drainage, pain, and swelling associated with a defect in the skin along the ventral aspect of the first toe. EXAM: LEFT FOOT - COMPLETE 3+ VIEW COMPARISON:  None in PACs FINDINGS: There is diffuse swelling of the great toe. There is soft tissue gas both proximally and distally in the toe. None extends into the metatarsal region. No definite bony changes are seen suspicious for osteomyelitis. There is no acute fracture. There is mild degenerative change of the first metatarsophalangeal joint. The other phalanges appear normal. The metatarsals are intact. There are mild degenerative changes of the intertarsal joints. There are plantar and Achilles region calcaneal spurs. There is calcification within the plantar fascia.  IMPRESSION: Findings consistent with cellulitis of the great toe with soft tissue gas collections. No objective evidence of osteomyelitis. 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 Electronically Signed   By: David  [place] M.D.   On: 05/04/2017 11:43    Procedures Procedures (including critical care time)  Medications Ordered in ED Medications  vancomycin (VANCOCIN) 2,000 mg in sodium chloride 0.9 % 500 mL IVPB (2,000 mg Intravenous New Bag/Given 05/05/17 2130)  vancomycin (VANCOCIN) 1,250 mg in sodium chloride 0.9 % 250 mL IVPB (not administered)  metroNIDAZOLE (FLAGYL) IVPB 500 mg (not administered)  0.9 %  sodium chloride infusion (not administered)  insulin aspart (novoLOG) injection 0-15 Units (not administered)  insulin aspart (novoLOG) injection 0-5 Units (not administered)  acetaminophen (TYLENOL) tablet 650 mg (not administered)  HYDROcodone-acetaminophen (NORCO/VICODIN) 5-325 MG per tablet 1-2  tablet (not administered)  morphine 2 MG/ML injection 2-4 mg (not administered)  HYDROmorphone (DILAUDID) injection 1 mg (1 mg Intravenous Given 05/05/17 2106)  sodium chloride 0.9 % bolus 1,000 mL (1,000 mLs Intravenous New Bag/Given 05/05/17 2106)     Initial Impression / Assessment and Plan / ED Course  I have reviewed the triage vital signs and the nursing notes.  Pertinent labs & imaging results that were available during my care of the patient were reviewed by me and considered in my medical decision making (see chart for details).  Clinical Course as of May 05 2250  Tue May 05, 2017  2200 I spoke with Dr Antionette Charpyd, [facility], who accepts pt for admission.  Requests call to ortho for consultation.    [EW]  2246 I spoke with Dr Ranell PatrickNorris, orthopedist, who recommends keeping pt NPO, will see him and determine what needs to be done.    [EW]    Clinical Course User Index [EW] Trixie DredgeWest, Consuelo Thayne, [place]-C    Pt sent to ED with known gas gangrene of left great toe with cellulitis,  osteomyelitis.  Pt had not seen a doctor in several years and was not known to be a diabetic.  Pt admitted to [facility], Dr Antionette Charpyd accepting, for new diagnosis of diabetes and osteomyelitis and gas gangrene of the toe.  Dr Ranell PatrickNorris, orthopedics, to consult.  Recommends keeping patient NPO until they see him and decide what needs to be done.    Final Clinical Impressions(s) / ED Diagnoses   Final diagnoses:  Type 2 diabetes mellitus with foot ulcer, without long-term current use of insulin (HCC)  Osteomyelitis of left foot, unspecified type [facility](HCC)    New Prescriptions Current Discharge Medication List       Trixie DredgeWest, Shloime Keilman, Cordelia Poche-C 05/05/17 2251    Vanetta MuldersZackowski, Scott, MD 05/08/17 [id number]

## 2017-05-06 ENCOUNTER — Inpatient Hospital Stay (HOSPITAL_COMMUNITY): Payer: 59 | Admitting: Certified Registered Nurse Anesthetist

## 2017-05-06 ENCOUNTER — Encounter (HOSPITAL_COMMUNITY): Payer: Self-pay | Admitting: Certified Registered Nurse Anesthetist

## 2017-05-06 ENCOUNTER — Encounter (HOSPITAL_COMMUNITY)
Admission: EM | Disposition: A | Discharge status: Home Health | Payer: Self-pay | Source: Home / Self Care | Attending: Internal Medicine

## 2017-05-06 DIAGNOSIS — M869 Osteomyelitis, unspecified: Secondary | ICD-10-CM | POA: Diagnosis present

## 2017-05-06 HISTORY — PX: AMPUTATION: SHX166

## 2017-05-06 LAB — CBC WITH DIFFERENTIAL/PLATELET
Basophils Absolute: 0 10*3/uL (ref 0.0–0.1)
Basophils Relative: 0 %
EOS ABS: 0.2 10*3/uL (ref 0.0–0.7)
EOS PCT: 1 %
HCT: 36.9 % — ABNORMAL LOW (ref 39.0–52.0)
HEMOGLOBIN: 12.6 g/dL — AB (ref 13.0–17.0)
LYMPHS PCT: 21 %
Lymphs Abs: 3.6 10*3/uL (ref 0.7–4.0)
MCH: 30.1 pg (ref 26.0–34.0)
MCHC: 34.1 g/dL (ref 30.0–36.0)
MCV: 88.1 fL (ref 78.0–100.0)
MONO ABS: 1.2 10*3/uL — AB (ref 0.1–1.0)
Monocytes Relative: 7 %
NEUTROS PCT: 71 %
Neutro Abs: 11.9 10*3/uL — ABNORMAL HIGH (ref 1.7–7.7)
PLATELETS: 291 10*3/uL (ref 150–400)
RBC: 4.19 MIL/uL — AB (ref 4.22–5.81)
RDW: 12.7 % (ref 11.5–15.5)
WBC: 16.9 10*3/uL — ABNORMAL HIGH (ref 4.0–10.5)

## 2017-05-06 LAB — BASIC METABOLIC PANEL
ANION GAP: 7 (ref 5–15)
BUN: 16 mg/dL (ref 6–20)
CALCIUM: 7.9 mg/dL — AB (ref 8.9–10.3)
CHLORIDE: 95 mmol/L — AB (ref 101–111)
CO2: 27 mmol/L (ref 22–32)
CREATININE: 0.78 mg/dL (ref 0.61–1.24)
GFR calc non Af Amer: >60 mL/min (ref >60)
Glucose, Bld: 229 mg/dL — ABNORMAL HIGH (ref 65–99)
Potassium: 4 mmol/L (ref 3.5–5.1)
SODIUM: 129 mmol/L — AB (ref 135–145)

## 2017-05-06 LAB — HIV ANTIBODY (ROUTINE TESTING W REFLEX): HIV Screen 4th Generation wRfx: NONREACTIVE

## 2017-05-06 LAB — GLUCOSE, CAPILLARY
GLUCOSE-CAPILLARY: 220 mg/dL — AB (ref 65–99)
GLUCOSE-CAPILLARY: 213 mg/dL — AB (ref 65–99)
GLUCOSE-CAPILLARY: 263 mg/dL — AB (ref 65–99)
GLUCOSE-CAPILLARY: 259 mg/dL — AB (ref 65–99)
Glucose-Capillary: 304 mg/dL — ABNORMAL HIGH (ref 65–99)
Glucose-Capillary: 244 mg/dL — ABNORMAL HIGH (ref 65–99)

## 2017-05-06 LAB — LACTIC ACID, PLASMA: Lactic Acid, Venous: 0.8 mmol/L (ref 0.5–1.9)

## 2017-05-06 SURGERY — AMPUTATION DIGIT
Anesthesia: General | Site: Toe | Laterality: Left

## 2017-05-06 MED ORDER — MIDAZOLAM HCL 2 MG/2ML IJ SOLN
INTRAMUSCULAR | Status: AC
  Filled 2017-05-06: qty 2

## 2017-05-06 MED ORDER — PROPOFOL 10 MG/ML IV BOLUS
INTRAVENOUS | Status: DC | PRN
  Administered 2017-05-06: 200 mg via INTRAVENOUS

## 2017-05-06 MED ORDER — INSULIN GLARGINE 100 UNIT/ML ~~LOC~~ SOLN
10.0000 [IU] | Freq: Every day | SUBCUTANEOUS | Status: DC
  Administered 2017-05-06 – 2017-05-08 (×3): 10 [IU] via SUBCUTANEOUS
  Filled 2017-05-06 (×5): qty 0.1

## 2017-05-06 MED ORDER — HYDROMORPHONE HCL 1 MG/ML IJ SOLN
INTRAMUSCULAR | Status: AC
  Filled 2017-05-06: qty 1

## 2017-05-06 MED ORDER — ONDANSETRON HCL 4 MG/2ML IJ SOLN: 4.0000 mg | Freq: Four times a day (QID) | INTRAMUSCULAR | Status: DC | PRN

## 2017-05-06 MED ORDER — FENTANYL CITRATE (PF) 100 MCG/2ML IJ SOLN
INTRAMUSCULAR | Status: DC | PRN
Start: 2017-05-06 — End: 2017-05-06
  Administered 2017-05-06: 100 ug via INTRAVENOUS

## 2017-05-06 MED ORDER — FENTANYL CITRATE (PF) 100 MCG/2ML IJ SOLN
INTRAMUSCULAR | Status: AC
  Filled 2017-05-06: qty 2

## 2017-05-06 MED ORDER — OXYCODONE HCL 5 MG PO TABS
5.0000 mg | ORAL_TABLET | ORAL | Status: DC | PRN
  Administered 2017-05-06: 10 mg via ORAL
  Filled 2017-05-06: qty 2

## 2017-05-06 MED ORDER — LACTATED RINGERS IV SOLN
INTRAVENOUS | Status: DC | PRN
  Administered 2017-05-06: 02:00:00 via INTRAVENOUS

## 2017-05-06 MED ORDER — ONDANSETRON HCL 4 MG PO TABS: 4.0000 mg | ORAL_TABLET | Freq: Four times a day (QID) | ORAL | Status: DC | PRN

## 2017-05-06 MED ORDER — METOCLOPRAMIDE HCL 5 MG/ML IJ SOLN: 5.0000 mg | Freq: Three times a day (TID) | INTRAMUSCULAR | Status: DC | PRN

## 2017-05-06 MED ORDER — PROPOFOL 10 MG/ML IV BOLUS
INTRAVENOUS | Status: AC
  Filled 2017-05-06: qty 20

## 2017-05-06 MED ORDER — INSULIN ASPART 100 UNIT/ML ~~LOC~~ SOLN
0.0000 [IU] | Freq: Three times a day (TID) | SUBCUTANEOUS | Status: DC
  Administered 2017-05-06: 11 [IU] via SUBCUTANEOUS
  Administered 2017-05-07 (×3): 7 [IU] via SUBCUTANEOUS
  Administered 2017-05-08: 3 [IU] via SUBCUTANEOUS
  Administered 2017-05-08 – 2017-05-09 (×3): 4 [IU] via SUBCUTANEOUS

## 2017-05-06 MED ORDER — SODIUM CHLORIDE 0.9 % IR SOLN
Status: DC | PRN
  Administered 2017-05-06: 3000 mL

## 2017-05-06 MED ORDER — HYDROMORPHONE HCL 1 MG/ML IJ SOLN: 1.0000 mg | INTRAMUSCULAR | Status: DC | PRN

## 2017-05-06 MED ORDER — PHENYLEPHRINE 40 MCG/ML (10ML) SYRINGE FOR IV PUSH (FOR BLOOD PRESSURE SUPPORT)
PREFILLED_SYRINGE | INTRAVENOUS | Status: AC
  Filled 2017-05-06: qty 20

## 2017-05-06 MED ORDER — MIDAZOLAM HCL 5 MG/5ML IJ SOLN
INTRAMUSCULAR | Status: DC | PRN
  Administered 2017-05-06: 2 mg via INTRAVENOUS

## 2017-05-06 MED ORDER — METOCLOPRAMIDE HCL 5 MG PO TABS: 5.0000 mg | ORAL_TABLET | Freq: Three times a day (TID) | ORAL | Status: DC | PRN

## 2017-05-06 MED ORDER — PREMIER PROTEIN SHAKE
11.0000 [oz_av] | Freq: Two times a day (BID) | ORAL | Status: DC
  Administered 2017-05-06 – 2017-05-09 (×4): 11 [oz_av] via ORAL
  Filled 2017-05-06 (×6): qty 325.31

## 2017-05-06 MED ORDER — HYDROMORPHONE HCL 1 MG/ML IJ SOLN
0.2500 mg | INTRAMUSCULAR | Status: DC | PRN
  Administered 2017-05-06 (×3): 0.5 mg via INTRAVENOUS

## 2017-05-06 MED ORDER — LIDOCAINE 2% (20 MG/ML) 5 ML SYRINGE
INTRAMUSCULAR | Status: DC | PRN
  Administered 2017-05-06: 60 mg via INTRAVENOUS

## 2017-05-06 MED ORDER — ACETAMINOPHEN 325 MG PO TABS: 650.0000 mg | ORAL_TABLET | Freq: Four times a day (QID) | ORAL | Status: DC | PRN

## 2017-05-06 MED ORDER — ACETAMINOPHEN 650 MG RE SUPP: 650.0000 mg | Freq: Four times a day (QID) | RECTAL | Status: DC | PRN

## 2017-05-06 MED ORDER — MORPHINE SULFATE (PF) 4 MG/ML IV SOLN
2.0000 mg | INTRAVENOUS | Status: DC | PRN
  Administered 2017-05-08: 2 mg via INTRAVENOUS
  Administered 2017-05-09: 4 mg via INTRAVENOUS
  Filled 2017-05-06 (×2): qty 1

## 2017-05-06 MED ORDER — SUCCINYLCHOLINE CHLORIDE 20 MG/ML IJ SOLN
INTRAMUSCULAR | Status: DC | PRN
  Administered 2017-05-06: 140 mg via INTRAVENOUS

## 2017-05-06 SURGICAL SUPPLY — 25 items
BAG SPEC THK2 15X12 ZIP CLS (MISCELLANEOUS) ×1
BAG ZIPLOCK 12X15 (MISCELLANEOUS) ×3 IMPLANT
BANDAGE ACE 6X5 VEL STRL LF (GAUZE/BANDAGES/DRESSINGS) ×3 IMPLANT
BLADE SURG SZ10 CARB STEEL (BLADE) ×6 IMPLANT
BNDG GAUZE ELAST 4 BULKY (GAUZE/BANDAGES/DRESSINGS) ×6 IMPLANT
COVER SURGICAL LIGHT HANDLE (MISCELLANEOUS) ×3 IMPLANT
DRSG PAD ABDOMINAL 8X10 ST (GAUZE/BANDAGES/DRESSINGS) ×3 IMPLANT
DURAPREP 26ML APPLICATOR (WOUND CARE) ×3 IMPLANT
ELECT REM PT RETURN 15FT ADLT (MISCELLANEOUS) ×3 IMPLANT
GAUZE SPONGE 4X4 12PLY STRL (GAUZE/BANDAGES/DRESSINGS) ×3 IMPLANT
GAUZE XEROFORM 5X9 LF (GAUZE/BANDAGES/DRESSINGS) ×3 IMPLANT
GLOVE ORTHO TXT STRL SZ7.5 (GLOVE) ×3 IMPLANT
GLOVE SURG ORTHO 8.5 STRL (GLOVE) ×3 IMPLANT
GOWN STRL REUS W/TWL LRG LVL3 (GOWN DISPOSABLE) ×6 IMPLANT
KIT BASIN OR (CUSTOM PROCEDURE TRAY) ×3 IMPLANT
MANIFOLD NEPTUNE II (INSTRUMENTS) ×3 IMPLANT
NS IRRIG 1000ML POUR BTL (IV SOLUTION) ×3 IMPLANT
PACK ORTHO EXTREMITY (CUSTOM PROCEDURE TRAY) ×3 IMPLANT
PAD CAST 4YDX4 CTTN HI CHSV (CAST SUPPLIES) IMPLANT
PADDING CAST COTTON 4X4 STRL (CAST SUPPLIES)
POSITIONER SURGICAL ARM (MISCELLANEOUS) ×3 IMPLANT
SUT ETHILON 3 0 PS 1 (SUTURE) IMPLANT
SUT NYLON 3 0 (SUTURE) IMPLANT
TOWEL OR 17X26 10 PK STRL BLUE (TOWEL DISPOSABLE) ×6 IMPLANT
WATER STERILE IRR 1500ML POUR (IV SOLUTION) ×3 IMPLANT

## 2017-05-06 NOTE — Progress Notes (Signed)
Spoke with patient at bedside. States he lives at home alone, does have friends available for support. New dx of diabetes, no PCP. Patient states he has a PCP that he needs to call. Encouraged him to do that sooner than later as he will need close f/u at d/c. No weight bearing status at this point, unsure of wound care at d/c. Plan for further surgery and [facility].

## 2017-05-06 NOTE — Progress Notes (Signed)
CSW consulted to assist with medications at dc. CSW is unable to assist with this request. In some circumstances  RNCM is able to assist with this request. RNCM has been consulted.  CSW signing off.  Cori RazorJamie Damoni Erker LCSW [phone]

## 2017-05-06 NOTE — Progress Notes (Signed)
Dr. Ranell PatrickNorris called nurse to alert nurse of [facility]. Dr. Ranell PatrickNorris can not reach attending at this time. Dr. Ranell PatrickNorris requested nurse to page attending and request attending to call Dr. Ranell PatrickNorris. Nurse paged attending. Attending, Dr. Blake DivineAkula, returned call and is aware of need to call Dr. Ranell PatrickNorris at [phone].

## 2017-05-06 NOTE — Anesthesia Preprocedure Evaluation (Signed)
Anesthesia Evaluation  Patient identified by MRN, date of birth, ID band Patient awake    Reviewed: Allergy & Precautions, NPO status , Patient's Chart, lab work & pertinent test results  Airway Mallampati: II  TM Distance: >3 FB Neck ROM: Full    Dental no notable dental hx.    Pulmonary neg pulmonary ROS, former smoker,    Pulmonary exam normal breath sounds clear to auscultation       Cardiovascular negative cardio ROS Normal cardiovascular exam Rhythm:Regular Rate:Normal     Neuro/Psych negative neurological ROS  negative psych ROS   GI/Hepatic negative GI ROS, Neg liver ROS,   Endo/Other  diabetes  Renal/GU negative Renal ROS  negative genitourinary   Musculoskeletal negative musculoskeletal ROS (+)   Abdominal   Peds negative pediatric ROS (+)  Hematology negative hematology ROS (+)   Anesthesia Other Findings   Reproductive/Obstetrics negative OB ROS                             Anesthesia Physical Anesthesia Plan  ASA: III and emergent  Anesthesia Plan: General   Post-op Pain Management:    Induction: Intravenous  PONV Risk Score and Plan: 1 and Ondansetron and Treatment may vary due to age  Airway Management Planned: LMA and Oral ETT  Additional Equipment:   Intra-op Plan:   Post-operative Plan: Extubation in OR  Informed Consent: I have reviewed the patients History and Physical, chart, labs and discussed the procedure including the risks, benefits and alternatives for the proposed anesthesia with the patient or authorized representative who has indicated his/her understanding and acceptance.   Dental advisory given  Plan Discussed with: CRNA and Surgeon  Anesthesia Plan Comments:         Anesthesia Quick Evaluation

## 2017-05-06 NOTE — Brief Op Note (Signed)
05/05/2017 - 05/06/2017  2:10 AM  PATIENT:  Russell Merritt  51 y.o. male  PRE-OPERATIVE DIAGNOSIS:  Left toe infection  POST-OPERATIVE DIAGNOSIS:  left toe infection,osteomyelitis  PROCEDURE:  Procedure(s): AMPUTATION GREAT TOE (Left), incision and drainage of abscess, deep cultures  SURGEON:  Surgeon(s) and Role:    Beverely Low* Tanna Loeffler, MD - Primary  PHYSICIAN ASSISTANT:   ASSISTANTS: none   ANESTHESIA:   general  EBL:  Total I/O In: 500 [I.V.:500] Out: 360 [Urine:350; Blood:10]  BLOOD ADMINISTERED:none  DRAINS: none   LOCAL MEDICATIONS USED:  NONE  SPECIMEN:  Source of Specimen:  tissue culture from infected left great toe  DISPOSITION OF SPECIMEN:  micro  COUNTS:  YES  TOURNIQUET:   Total Tourniquet Time Documented: Thigh (Left) - 16 minutes Total: Thigh (Left) - 16 minutes   DICTATION: .Other Dictation: Dictation Number [id number]  PLAN OF CARE: Admit to inpatient   PATIENT DISPOSITION:  PACU - hemodynamically stable.   Delay start of Pharmacological VTE agent (>24hrs) due to surgical blood loss or risk of bleeding: no

## 2017-05-06 NOTE — Progress Notes (Signed)
Pt off floor going to surgery. Belongings locked up with security

## 2017-05-06 NOTE — Op Note (Signed)
NAME:  OMARI, MCMANAWAY NO.:  MEDICAL RECORD NO.:  [id number]  LOCATION:                                 FACILITY:  PHYSICIAN:  Almedia Balls. Ranell Patrick, M.D.      DATE OF BIRTH:  DATE OF PROCEDURE:  05/05/2017 DATE OF DISCHARGE:                              OPERATIVE REPORT   PREOPERATIVE DIAGNOSIS:  Left great toe infection and osteomyelitis.  POSTOPERATIVE DIAGNOSIS:  Left great toe infection and osteomyelitis.  PROCEDURE PERFORMED:  Incision and drainage as well as left grade 2 amputation with deep tissue cultures, left great toe.  ATTENDING SURGEON:  Almedia Balls. Ranell Patrick, M.D.  ASSISTANT:  None.  ANESTHESIA:  General anesthesia was used.  ESTIMATED BLOOD LOSS:  Minimal.  FLUID REPLACEMENT:  1000 mL crystalloid.  INSTRUMENT COUNTS:  Correct.  COMPLICATIONS:  There were no complications.  ANTIBIOTICS:  Perioperative antibiotics were given.  Cultures sent and these were deep tissue cultures from the infected toe to Microbiology.  INDICATIONS:  The patient is a 51 year old undiagnosed diabetic male, who presents with a 2-week history of increasing left great toe swelling and pain, presented with an ulcer, obvious cellulitis and potential deeper infection to outpatient evaluation at Urgent Care with Podiatry. The patient did have an outpatient x-rays and MRI scan indicating osteomyelitis.  The patient referred to [facility] Emergency Room for further evaluation, Orthopedic was consulted.  The patient admitted by Internal Medicine.  I discussed with the patient that he has an infection involving the bone as well as deep soft tissues and based on progressive erythema and swelling, elevated white count, fevers, he definitely was getting sicker and felt that he needed urgent/emergent great toe amputation as well as intravenous antibiotics and admission. The patient agreed to this, informed consent obtained.  DESCRIPTION OF PROCEDURE:  After an adequate  level of anesthesia achieved, the patient positioned supine on the operating room table, left leg correctly identified.  Nonsterile tourniquet placed on the proximal calf.  The left leg sterilely prepped and draped in usual manner.  Time-out called.  At this point, we elevated the leg for approximately 2 minutes and then elevated the tourniquet to 300 mmHg. The patient had an obviously grossly infected great toe with a plantar hole about 3 mm in diameter that extended deep.  There was actually significant change even the last hour of increasing duskiness and devitalization of the great toe.  The erythema extended to the metatarsophalangeal joint area.  I did a medial based incision starting over the metatarsal of the great toe and extending up over the MTP joint and then essentially a circumferential incision around the base of the great toe, trying to save as much tissue length as possible extending over the proximal phalanx, but making sure we removed all devitalized and grossly infected tissue, dissected straight down to bone and then went proximally to the MTP joint and used a fresh 15-blade scalpel to incise the joint capsule for the MTP joint.  Gross pus encountered coming from the base of the proximal phalanx and possibly even the MTP joint, that was cultured, both aerobic, anaerobic and Gram stain for routine cultures.  We then went ahead and removed the great toe, had a pretty clean wound bed.  I was able to do some more sharp debridement with the rongeur.  There was quite a bit of cartilage loss on the head of the metatarsal for the great toe and I went ahead and removed the remaining cartilage where the some of that may have been infected.  We got back to good bleeding bone, but left the metatarsal head in place. We irrigated thoroughly, pulse irrigation for 3 liters of irrigation and used nylon closure for the metatarsal medial extension of that incision, but left the distal  wound open and packed that to control and allow for control of dead space and allow for decompression and drainage from the wound.  I did not see any more purulence.  I did basically milk the forefoot to make sure there was no additional purulent material present. None was encountered prior to packing it with a moist to dry dressing with Kerlix and then fluffed up 4 x 4s and more Kerlix and a compressive 4 x 4 bandage.  Tourniquet was deflated.  There was no weeping or leakage into the bandage and the patient was transported to the recovery room in stable condition having tolerated the surgery well.     Almedia BallsSteven R. Ranell PatrickNorris, M.D.     SRN/MEDQ  D:  05/06/2017  T:  05/06/2017  Job:  [id number]

## 2017-05-06 NOTE — Progress Notes (Signed)
PROGRESS NOTE    Russell Merritt  WUJ:[id number] DOB: 1966/07/12 DOA: 05/05/2017 PCP: Patient, No Pcp Per   Brief Narrative:  Russell Merritt is a 51 y.o. male who denies any significant past medical history,  has not seen a physician in many years, come  to the emergency department at the direction of his podiatrist for evaluation of pain, swelling, discoloration, and drainage from his left great toe, fevers and generalized malaise. MRI of the left toes show Gas gangrene of the great toe. 2. Osteomyelitis of the proximal and distal phalanges of the great toe with gas within the bones.  orthopedics consulted and he underwent amputation of the left great toe.  He is being transferred to [facility] for further evaluation by Dr Lajoyce Corners and for possible debridement of the left foot osteomyelitis.   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 Cultures from OR sent and pending.  Blood culture done and pending, negative so far.  Will need further debridement of the left foot by Dr Lajoyce Corners at [facility].  Resume IV vancomycin , and pain control with IV dilaudid and prn norco for pain control.  Appreciate orthopedics recommendations.  Lactic acid wnl Improving leukocytosis with IV antibiotics.   ? New Onset DM:  CBG (last 3)   Recent Labs  05/06/17 0227 05/06/17 0825 05/06/17 1145  GLUCAP 220* 304* 244*    cbg's not well controlled.  hgba1c is pending.  Start him on 10 units of Lantus and increase the SSI to resistant scale.   Hyponatremia:  Suspect from marked  hyperglycemia and hypovolemia.  Slight improvement with fluids.  Monitor.     DVT prophylaxis: (scd's) Code Status: (Full) Family Communication: none at bedside.  Disposition Plan: pending further evaluation by Dr Lajoyce Corners.   Consultants:   Orthopedics, dr Ranell Patrick and Dr Lajoyce Corners.   Procedures: amputation of the left great toe on 6/5   Antimicrobials: vancomycin 6/5   Subjective: Pain sub optimally controlled.   Objective: Vitals:   05/06/17 0430 05/06/17 0530 05/06/17 0630 05/06/17 1028  BP: 134/63 136/67 132/66 131/65  Pulse: 78 77 66 69  Resp: 18 18 18 18   Temp: 98.7 F (37.1 C) 98.7 F (37.1 C) 98.2 F (36.8 C) 98.4 F (36.9 C)  TempSrc: Oral Oral Oral Oral  SpO2: 98% 94% 98% 95%  Weight:      Height:        Intake/Output Summary (Last 24 hours) at 05/06/17 1226 Last data filed at 05/06/17 1028  Gross per 24 hour  Intake             2560 ml  Output             1385 ml  Net             1175 ml   Filed Weights   05/05/17 1711  Weight: 122.5 kg (270 lb)    Examination:  General exam: Appears anxious, Respiratory system: Clear to auscultation. Respiratory effort normal. Cardiovascular system: S1 & S2  heard, RRR. No JVD, murmurs, rubs, gallops or clicks. Gastrointestinal system: Abdomen is nondistended, soft and nontender. No organomegaly or masses felt. Normal bowel sounds heard. Central nervous system: Alert and oriented. No focal neurological deficits. Extremities: left toe amputation, bandaged.  Psychiatry: Judgement and insight appear normal. Mood & affect appropriate.     Data Reviewed: I have personally reviewed following labs and imaging studies  CBC:  Recent Labs Lab 05/05/17 2041 05/06/17 0444  WBC 17.1* 16.9*  NEUTROABS 12.5* 11.9*  HGB 14.4 12.6*  HCT 40.3 36.9*  MCV 86.9 88.1  PLT 285 291   Basic Metabolic Panel:  Recent Labs Lab 05/05/17 2041 05/06/17 0444  NA 128* 129*  K 4.1 4.0  CL 91* 95*  CO2 27 27  GLUCOSE 356* 229*   BUN 18 16  CREATININE 0.94 0.78  CALCIUM 8.7* 7.9*   GFR: Estimated Creatinine Clearance: 147.7 mL/min (by C-G formula based on SCr of 0.78 mg/dL). Liver Function Tests: No results for input(s): AST, ALT, ALKPHOS, BILITOT, PROT, ALBUMIN in the last 168 hours. No results for input(s): LIPASE, AMYLASE in the last 168 hours. No results for input(s): AMMONIA in the last 168 hours. Coagulation Profile: No results for input(s): INR, PROTIME in the last 168 hours. Cardiac Enzymes: No results for input(s): CKTOTAL, CKMB, CKMBINDEX, TROPONINI in the last 168 hours. BNP (last 3 results) No results for input(s): PROBNP in the last 8760 hours. HbA1C: No results for input(s): HGBA1C in the last 72 hours. CBG:  Recent Labs Lab 05/05/17 2118 05/05/17 2312 05/06/17 0227 05/06/17 0825 05/06/17 1145  GLUCAP 312* 214* 220* 304* 244*   Lipid Profile: No results for input(s): CHOL, HDL, LDLCALC, TRIG, CHOLHDL, LDLDIRECT in the last 72 hours. Thyroid Function Tests: No results for input(s): TSH, T4TOTAL, FREET4, T3FREE, THYROIDAB in the last 72 hours. Anemia Panel: No results for input(s): VITAMINB12, FOLATE, FERRITIN, TIBC, IRON, RETICCTPCT in the last 72 hours. Sepsis Labs:  Recent Labs Lab 05/05/17 2305 05/06/17 0444  LATICACIDVEN 1.1 0.8    Recent Results (from the past 240 hour(s))  Wound or Superficial Culture     Status: None (Preliminary result)   Collection Time: 05/05/17  9:30 PM  Result Value Ref Range Status   Specimen Description WOUND LEFT FOOT  Final   Special Requests NONE  Final   Gram Stain   Final    MODERATE WBC PRESENT, PREDOMINANTLY PMN MODERATE GRAM POSITIVE COCCI IN PAIRS IN CHAINS FEW GRAM NEGATIVE RODS    Culture   Final    TOO YOUNG TO READ Performed at [facility], [address] [place], [place] [address]    Report Status PENDING  Incomplete         Radiology Studies: Mr Toes Left Wo Contrast  Result Date: 05/04/2017 CLINICAL DATA:   Diabetic ulcer of the great toe. Pain. Swelling and redness. EXAM: MRI OF THE LEFT TOES WITHOUT CONTRAST TECHNIQUE: Multiplanar, multisequence MR imaging of the left toes was performed. No intravenous contrast was administered. COMPARISON:  Radiographs dated 05/04/2017 FINDINGS: Bones/Joint/Cartilage There is osteomyelitis of the proximal and distal phalanges of the great toe with gas with in the bones. There is also gas in the adjacent soft tissues with dorsal and ventral to the bones. 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 Soft tissue ulceration noted on the plantar aspect of great toe. No discrete abscess Moderate arthritis of the first MTP joint. The bones of the other toes are normal. IMPRESSION: 1. Gas gangrene of the  great toe. 2. Osteomyelitis of the proximal and distal phalanges of the great toe with gas within the bones. Electronically Signed   By: Francene Boyers M.D.   On: 05/04/2017 17:17        Scheduled Meds: . HYDROmorphone      . HYDROmorphone      . insulin aspart  0-15 Units Subcutaneous TID WC  . insulin aspart  0-5 Units Subcutaneous QHS  . protein supplement shake  11 oz Oral BID BM   Continuous Infusions: . metronidazole Stopped (05/06/17 0714)  . vancomycin Stopped (05/06/17 1048)     LOS: 1 day    Time spent: 45 minutes.    Kathlen Mody, MD [facility] Pager [phone]   If 7PM-7AM, please contact night-coverage [email or web address] Password [email or web address] 05/06/2017, 12:26 PM

## 2017-05-06 NOTE — Anesthesia Procedure Notes (Signed)
Procedure Name: Intubation Performed by: Kena Limon J Pre-anesthesia Checklist: Patient identified, Emergency Drugs available, Suction available, Patient being monitored and Timeout performed Patient Re-evaluated:Patient Re-evaluated prior to inductionOxygen Delivery Method: Circle system utilized Preoxygenation: Pre-oxygenation with 100% oxygen Intubation Type: IV induction Ventilation: Mask ventilation without difficulty Laryngoscope Size: Mac and 4 Grade View: Grade II Tube type: Oral Tube size: 7.5 mm Number of attempts: 1 Airway Equipment and Method: Stylet Placement Confirmation: ETT inserted through vocal cords under direct vision,  positive ETCO2,  CO2 detector and breath sounds checked- equal and bilateral Secured at: 23 cm Tube secured with: Tape Dental Injury: Teeth and Oropharynx as per pre-operative assessment        

## 2017-05-06 NOTE — Transfer of Care (Signed)
Immediate Anesthesia Transfer of Care Note  Patient: Russell Merritt  Procedure(s) Performed: Procedure(s): AMPUTATION GREAT TOE (Left)  Patient Location: PACU  Anesthesia Type:General  Level of Consciousness: awake, alert  and oriented  Airway & Oxygen Therapy: Patient Spontanous Breathing and Patient connected to face mask oxygen  Post-op Assessment: Report given to RN and Post -op Vital signs reviewed and stable  Post vital signs: Reviewed and stable  Last Vitals:  Vitals:   05/06/17 0057 05/06/17 0218  BP: (!) 148/72   Pulse: 73 (P) 93  Resp: 18   Temp: 37.1 C     Last Pain:  Vitals:   05/06/17 0057  TempSrc: Oral  PainSc:          Complications: No apparent anesthesia complications

## 2017-05-06 NOTE — Progress Notes (Signed)
Nurse gave report to receiving nurse at 5N-08[address]. Patient left floor with [facility].

## 2017-05-06 NOTE — Progress Notes (Signed)
Initial Nutrition Assessment  DOCUMENTATION CODES:   Obesity unspecified  INTERVENTION:  - Will order Premier Protein BID, each supplement provides 160 kcal, 5 grams of carb, and 30 grams of protein.  - Continue to encourage PO of meals and of supplements. - RD will continue to monitor for needs, including need for education prior to d/c.  NUTRITION DIAGNOSIS:   Inadequate oral intake related to acute illness, poor appetite as evidenced by per patient/family report.  GOAL:   Patient will meet greater than or equal to 90% of their needs  MONITOR:   PO intake, Supplement acceptance, Weight trends, Labs, I & O's  REASON FOR ASSESSMENT:   Consult Wound healing  ASSESSMENT:   51 y.o. male who denies any significant past medical history, while acknowledging that he has not seen a physician in many years, now presenting to the emergency department at the direction of his podiatrist for evaluation of pain, swelling, discoloration, and drainage from his left great toe, as well as fevers and nonspecific malaise. Patient reports that he had been in his usual state of health until 8 days ago when he noted some pain and redness involving the left first toe. He noted an ulcer at the plantar aspect of the toe at that time and reports cleaning it with peroxide and applying topical antibiotic ointment. Over the ensuing days, redness and pain increased and swelling developed. 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   Pt seen for consult. BMI indicates obesity. No intakes documented since admission. Pt states that for breakfast he had 2 bowls of Cheerios and then felt full; he usually has 2 biscuits with sausage gravy and several cups of coffee for breakfast. He states that appetite has been decreased x1 week PTA, but that in the summer he  usually experiences decreased appetite d/t heat. He gives the example of ordering a cheese burger but only eating a few bites of it because of feeling bloated and miserable from the heat. He states that he sweats profusely but that he is mindful to drink more fluids during the summer.   Pt with no hx of DM but notes indicate new dx of the same. Pt asks RD if he has diabetes and states no one has informed him one way or the other. Informed pt that MD will review this information and let him know if he does or does not have DM. RD saw that DM Coordinator has been consulted. Will continue to monitor for need for additional education prior to d/c.   Physical assessment shows no muscle and no fat wasting. Pt states that he is unsure of recent usual weight but that he always loses weight in the summer. No weight in the chart PTA.  Medications reviewed; sliding scale Novolog.  Labs reviewed; CBGs: 220 and 304 mg/dL, Na: 960 mmol/L, Cl: 95 mmol/L, Ca: 7.9 mg/dL.   Diet Order:  Diet Carb Modified Fluid consistency: Thin; Room service appropriate? Yes  Skin:  Wound (see comment) (L great toe infection)  Last BM:  6/5  Height:   Ht Readings from Last 1 Encounters:  05/05/17 6' (1.829 m)    Weight:   Wt Readings from Last 1 Encounters:  05/05/17 270 lb (122.5 kg)    Ideal Body Weight:  80.91 kg  BMI:  Body mass index is 36.62 kg/m.  Estimated Nutritional Needs:   Kcal:  1840-2085 (15-17 kcal/kg)  Protein:  100-115 grams  Fluid:  1.8-2 L/day  EDUCATION NEEDS:   Education needs no appropriate at this time    Trenton GammonJessica Arayah Krouse, MS, RD, LDN, CNSC Inpatient Clinical Dietitian Pager # [phone] After hours/weekend pager # [phone]

## 2017-05-06 NOTE — Progress Notes (Signed)
Orthopedics Progress Note  Subjective: Patient feeling better this afternoon.  He understands the [facility] under Dr Audrie Liauda's care.  Objective:  Vitals:   05/06/17 0630 05/06/17 1028  BP: 132/66 131/65  Pulse: 66 69  Resp: 18 18  Temp: 98.2 F (36.8 C) 98.4 F (36.9 C)    General: Awake and alert  Musculoskeletal: Left foot dressing CDI, no drainage, other toes pink and able to wiggle Neurovascularly intact  Lab Results  Component Value Date   WBC 16.9 (H) 05/06/2017   HGB 12.6 (L) 05/06/2017   HCT 36.9 (L) 05/06/2017   MCV 88.1 05/06/2017   PLT 291 05/06/2017       Component Value Date/Time   NA 129 (L) 05/06/2017 0444   K 4.0 05/06/2017 0444   CL 95 (L) 05/06/2017 0444   CO2 27 05/06/2017 0444   GLUCOSE 229 (H) 05/06/2017 0444   BUN 16 05/06/2017 0444   CREATININE 0.78 05/06/2017 0444   CALCIUM 7.9 (L) 05/06/2017 0444   GFRNONAA >60 05/06/2017 0444   GFRAA >60 05/06/2017 0444    No results found for: INR, PROTIME  Assessment/Plan: POD #1 s/p Procedure(s): I+D of foot abscess and AMPUTATION GREAT TOE Patient on empiric antibiotics for polymicrobial infection consistent with diabetic infection (Gram negative and positive) Cultures pending. Dr Aldean BakerMarcus Duda to [facility] and consult on revision to great toe MP amputation and definitive closure.  Thanks!   Almedia BallsSteven R. Ranell PatrickNorris, MD 05/06/2017 2:57 PM

## 2017-05-06 NOTE — Consult Note (Signed)
Reason for Consult:Left great toe infection Referring Physician: Opyd MD  Russell Merritt is an 51 y.o. male.  HPI: 51 yo male with no prior history of diabetes that presents with a several week history of increasing left great toe swelling and pain.  Patient works in [employer] and was unaware of the severity of the toe until presenting yesterday to urgent care and podiatry.  Patient referred for outpatient MRI that reveals osteo and gas gangrene. Patient referred to [facility] for further eval and treatment.   History reviewed. No pertinent past medical history.  Past Surgical History:  Procedure Laterality Date  . HAND SURGERY Left   . TONSILLECTOMY      Family History  Problem Relation Age of Onset  . Family history unknown: Yes    Social History:  reports that he has quit smoking. He has never used smokeless tobacco. He reports that he does not drink alcohol or use drugs.  Allergies: No Known Allergies  Medications: I have reviewed the patient's current medications.  Results for orders placed or performed during the hospital encounter of 05/05/17 (from the past 48 hour(s))  Basic metabolic panel     Status: Abnormal   Collection Time: 05/05/17  8:41 PM  Result Value Ref Range   Sodium 128 (L) 135 - 145 mmol/L   Potassium 4.1 3.5 - 5.1 mmol/L   Chloride 91 (L) 101 - 111 mmol/L   CO2 27 22 - 32 mmol/L   Glucose, Bld 356 (H) 65 - 99 mg/dL   BUN 18 6 - 20 mg/dL   Creatinine, Ser 0.94 0.61 - 1.24 mg/dL   Calcium 8.7 (L) 8.9 - 10.3 mg/dL   GFR calc non Af Amer >60 >60 mL/min   GFR calc Af Amer >60 >60 mL/min    Comment: (NOTE) The eGFR has been calculated using the CKD EPI equation. This calculation has not been validated in all clinical situations. eGFR's persistently <60 mL/min signify possible Chronic Kidney Disease.    Anion gap 10 5 - 15  CBC with Differential     Status: Abnormal   Collection Time: 05/05/17  8:41 PM  Result Value Ref Range   WBC 17.1 (H) 4.0  - 10.5 K/uL   RBC 4.64 4.22 - 5.81 MIL/uL   Hemoglobin 14.4 13.0 - 17.0 g/dL   HCT 40.3 39.0 - 52.0 %   MCV 86.9 78.0 - 100.0 fL   MCH 31.0 26.0 - 34.0 pg   MCHC 35.7 30.0 - 36.0 g/dL   RDW 12.7 11.5 - 15.5 %   Platelets 285 150 - 400 K/uL   Neutrophils Relative % 73 %   Lymphocytes Relative 20 %   Monocytes Relative 6 %   Eosinophils Relative 1 %   Basophils Relative 0 %   Neutro Abs 12.5 (H) 1.7 - 7.7 K/uL   Lymphs Abs 3.4 0.7 - 4.0 K/uL   Monocytes Absolute 1.0 0.1 - 1.0 K/uL   Eosinophils Absolute 0.2 0.0 - 0.7 K/uL   Basophils Absolute 0.0 0.0 - 0.1 K/uL   WBC Morphology VACUOLATED NEUTROPHILS   CBG monitoring, ED     Status: Abnormal   Collection Time: 05/05/17  9:18 PM  Result Value Ref Range   Glucose-Capillary 312 (H) 65 - 99 mg/dL   Comment 1 Notify RN    Comment 2 Document in Chart   Lactic acid, plasma     Status: None   Collection Time: 05/05/17 11:05 PM  Result Value Ref Range  Lactic Acid, Venous 1.1 0.5 - 1.9 mmol/L  Glucose, capillary     Status: Abnormal   Collection Time: 05/05/17 11:12 PM  Result Value Ref Range   Glucose-Capillary 214 (H) 65 - 99 mg/dL    Mr Toes Left Wo Contrast  Result Date: 05/04/2017 CLINICAL DATA:  Diabetic ulcer of the great toe. Pain. Swelling and redness. EXAM: MRI OF THE LEFT TOES WITHOUT CONTRAST TECHNIQUE: Multiplanar, multisequence MR imaging of the left toes was performed. No intravenous contrast was administered. COMPARISON:  Radiographs dated 05/04/2017 FINDINGS: Bones/Joint/Cartilage There is osteomyelitis of the proximal and distal phalanges of the great toe with gas with in the bones. There is also gas in the adjacent soft tissues with dorsal and ventral to the bones. 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 Soft tissue ulceration noted on the plantar aspect of great toe. No discrete abscess Moderate arthritis of the first MTP joint. The bones of the other toes are normal. IMPRESSION: 1. Gas gangrene of the great toe. 2.  Osteomyelitis of the proximal and distal phalanges of the great toe with gas within the bones. Electronically Signed   By: Lorriane Shire M.D.   On: 05/04/2017 17:17   Dg Foot Complete Left  Result Date: 05/04/2017 CLINICAL DATA:  Drainage, pain, and swelling associated with a defect in the skin along the ventral aspect of the first toe. EXAM: LEFT FOOT - COMPLETE 3+ VIEW COMPARISON:  None in PACs FINDINGS: There is diffuse swelling of the great toe. There is soft tissue gas both proximally and distally in the toe. None extends into the metatarsal region. No definite bony changes are seen suspicious for osteomyelitis. There is no acute fracture. There is mild degenerative change of the first metatarsophalangeal joint. The other phalanges appear normal. The metatarsals are intact. There are mild degenerative changes of the intertarsal joints. There are plantar and Achilles region calcaneal spurs. There is calcification within the plantar fascia. IMPRESSION: Findings consistent with cellulitis of the great toe with soft tissue gas collections. No objective evidence of osteomyelitis. 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 Electronically Signed   By: David  Martinique M.D.   On: 05/04/2017 11:43    ROS Blood pressure (!) 166/74, pulse 77, temperature (!) 100.6 F (38.1 C), temperature source Oral, resp. rate 18, height 6' (1.829 m), weight 122.5 kg (270 lb), SpO2 93 %. Physical Exam Patient is a healthy appearing male in mod distress due to left great toe pain  Left LE with moderate generalized foot swelling and some edema above the ankle The left great toe is grossly swollen with erythema and drainage from a 2-3 mm hole on the plantar aspect of the toe. Dusky purple color noted around the nail bed.  Some erythema affecting the second toe as well. Pulses intact  Assessment/Plan: Left great toe gas gangrene and osteo of the proximal and distal phalanges.  I discussed with the patient the need for  urgent surgical debridement and amputation of at least the great toe at the MTP joint.  I suspect that the patient will require several surgeries to definitively deal with the soft tissue necrosis that is noted at the bedside and still try to preserve as much length of the first ray as possible for balance and gait.  I will contact Dr Meridee Score tomorrow after damage control surgery is performed urgently tonight/this morning.  Russell Merritt,Russell Merritt 05/06/2017, 12:42 AM

## 2017-05-07 ENCOUNTER — Encounter (HOSPITAL_COMMUNITY): Payer: Self-pay | Admitting: Orthopedic Surgery

## 2017-05-07 ENCOUNTER — Other Ambulatory Visit (INDEPENDENT_AMBULATORY_CARE_PROVIDER_SITE_OTHER): Payer: Self-pay | Admitting: Family

## 2017-05-07 DIAGNOSIS — M86272 Subacute osteomyelitis, left ankle and foot: Secondary | ICD-10-CM

## 2017-05-07 LAB — GLUCOSE, CAPILLARY
GLUCOSE-CAPILLARY: 240 mg/dL — AB (ref 65–99)
Glucose-Capillary: 210 mg/dL — ABNORMAL HIGH (ref 65–99)
Glucose-Capillary: 250 mg/dL — ABNORMAL HIGH (ref 65–99)
Glucose-Capillary: 250 mg/dL — ABNORMAL HIGH (ref 65–99)

## 2017-05-07 LAB — HEMOGLOBIN A1C
Hgb A1c MFr Bld: 13.9 % — ABNORMAL HIGH (ref 4.8–5.6)
MEAN PLASMA GLUCOSE: 352 mg/dL

## 2017-05-07 MED ORDER — LIVING WELL WITH DIABETES BOOK
Freq: Once | Status: AC
  Administered 2017-05-07: 15:00:00
  Filled 2017-05-07: qty 1

## 2017-05-07 MED ORDER — CHLORHEXIDINE GLUCONATE 4 % EX LIQD
60.0000 mL | Freq: Once | CUTANEOUS | Status: DC
  Filled 2017-05-07: qty 15

## 2017-05-07 MED ORDER — DEXTROSE 5 % IV SOLN
3.0000 g | INTRAVENOUS | Status: AC
  Administered 2017-05-08: 3 g via INTRAVENOUS
  Filled 2017-05-07: qty 3000

## 2017-05-07 NOTE — Anesthesia Postprocedure Evaluation (Signed)
Anesthesia Post Note  Patient: Russell Merritt  Procedure(s) Performed: Procedure(s) (LRB): AMPUTATION GREAT TOE (Left)     Patient location during evaluation: PACU Anesthesia Type: General Level of consciousness: awake and alert Pain management: pain level controlled Vital Signs Assessment: post-procedure vital signs reviewed and stable Respiratory status: spontaneous breathing, nonlabored ventilation, respiratory function stable and patient connected to nasal cannula oxygen Cardiovascular status: blood pressure returned to baseline and stable Postop Assessment: no signs of nausea or vomiting Anesthetic complications: no    Last Vitals:  Vitals:   05/07/17 0026 05/07/17 0510  BP: (!) 147/64 (!) 158/74  Pulse: 74 73  Resp: 18 18  Temp: 37.8 C 37.4 C    Last Pain:  Vitals:   05/07/17 0652  TempSrc:   PainSc: Asleep                 [name] S

## 2017-05-07 NOTE — Progress Notes (Signed)
Inpatient Diabetes Program Recommendations  AACE/ADA: New Consensus Statement on Inpatient Glycemic Control (2015)  Target Ranges:  Prepandial:   less than 140 mg/dL      Peak postprandial:   less than 180 mg/dL (1-2 hours)      Critically ill patients:  140 - 180 mg/dL   Lab Results  Component Value Date   GLUCAP 210 (H) 05/07/2017   HGBA1C 13.9 (H) 05/06/2017   Review of Glycemic Control  Diabetes history: None Current orders for Inpatient glycemic control: Lantus 10 units, Novolog Resistant + HS scale  Inpatient Diabetes Program Recommendations:    A1c 13.9%, per ADA level meets criteria for new diagnosis of DM. If patient is to be diagnosed, please inform patient and place consult for DM education to begin. Patient has insurance and will meet criteria for an Ambulatory Referral for Outpatient DM Education at the [facility]. Glucose 210 this am. Please consider increasing Lantus to 14 units.  Thanks,  Russell DeemShannon Ailish Prospero RN, MSN, [facility] Inpatient Diabetes Coordinator Team Pager [phone] (8a-5p)

## 2017-05-07 NOTE — Consult Note (Signed)
ORTHOPAEDIC CONSULTATION  REQUESTING PHYSICIAN: Rhetta MuraSamtani, Jai-Gurmukh, MD  Chief Complaint:   HPI: Russell Merritt is a 51 y.o. male who presents with recently diagnosed diabetic who has had a one-month history of ulceration of the left great toe. Patient states that he has been to podiatry twice in the second time a recommended him proceeding to the emergency room for evaluation and treatment. Patient was initially seen by orthopedics, Dr. Ranell PatrickNorris, and underwent emergent amputation of the great toe at the MTP joint and patient is seen now in follow-up for definitive treatment and foot salvage.  History reviewed. No pertinent past medical history. Past Surgical History:  Procedure Laterality Date  . HAND SURGERY Left   . TONSILLECTOMY     Social History   Social History  . Marital status: Divorced    Spouse name: N/A  . Number of children: N/A  . Years of education: N/A   Social History Main Topics  . Smoking status: Former Games developermoker  . Smokeless tobacco: Never Used  . Alcohol use No  . Drug use: No  . Sexual activity: Not Asked   Other Topics Concern  . None   Social History Narrative  . None   Family History  Problem Relation Age of Onset  . Family history unknown: Yes   - negative except otherwise stated in the family history section No Known Allergies Prior to Admission medications   Medication Sig Start Date End Date Taking? Authorizing Provider  amoxicillin-clavulanate (AUGMENTIN) 875-125 MG tablet Take 1 tablet by mouth 2 (two) times daily. 05/04/17  Yes Merritt, Russell O, DPM  ibuprofen (ADVIL,MOTRIN) 200 MG tablet Take 800 mg by mouth every 8 (eight) hours as needed for moderate pain.    Yes [provider]  MULTIPLE VITAMIN PO Take 1 tablet by mouth daily.    Yes [provider]  clindamycin (CLEOCIN T) 1 % external solution Apply 1 application topically 2 (two) times daily.  05/05/17   [provider]   No results found. - pertinent  xrays, CT, MRI studies were reviewed and independently interpreted  Positive ROS: All other systems have been reviewed and were otherwise negative with the exception of those mentioned in the HPI and as above.  Physical Exam: General: Alert, no acute distress Psychiatric: Patient is competent for consent with normal mood and affect Lymphatic: No axillary or cervical lymphadenopathy Cardiovascular: No pedal edema Respiratory: No cyanosis, no use of accessory musculature GI: No organomegaly, abdomen is soft and non-tender  Skin: Patient has an open wound over the MTP joint with amputation of the great toe. There is ischemic changes to the soft tissue as well as ischemic changes along the border of the second toe.   Neurologic: Patient does not have protective sensation bilateral lower extremities.   MUSCULOSKELETAL:  On examination patient has a good dorsalis pedis pulse he has good hair growth in his leg. No venous stasis ulcers. He has an open wound at the MTP joint with no abscess no purulent drainage.  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  Plan: Plan: We'll plan for a first ray amputation possible second toe amputation. Risk and benefits were discussed including risk of the wound not healing. Patient works in [employer] and discussed that he would need to be off his foot for at least a month postoperatively. I'll order a hemoglobin A1c  Thank you for  the consult and the opportunity to see [name], MD [facility] [phone] 6:46 AM

## 2017-05-07 NOTE — Progress Notes (Signed)
[facility] Progress Note  Patient: Russell Merritt ZOX:[id number]   PCP: Patient, No Pcp Per DOB: [birth year]   DOA: 05/05/2017   DOS: 05/07/2017   Date of Service: the patient was seen and examined on 05/07/2017  Subjective: Continues to have pain in the foot no nausea no vomiting.  Brief hospital course: Pt. with PMH of diabetes undiagnosed.; admitted on 05/05/2017, presented with complaint of foot infection, was found to have osteomyelitis of the left toe. Currently further plan is scheduled palpitation.  Assessment and Plan: 1. 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 Diabetes mellitus, new diagnosis uncontrolled with hyperglycemia - Pt noted to have random CBG in 443 at urgent care day prior to admission  - Serum glucose is 356 on presentation here  - Significantly elevated A1c, - m continue sliding scale  3. Hyponatremia  - Resolved, pseudohyponatremia due to hyperglycemia  Diet: carb modified DVT Prophylaxis: subcutaneous Heparin  Advance goals of care discussion: full code  Family Communication: no family was present at bedside, at the time of interview.  Disposition:  Discharge to be determined .  Consultants: orthopedics Procedures: none  Antibiotics: Anti-infectives    Start     Dose/Rate Route Frequency Ordered Stop   05/06/17 1000  vancomycin (VANCOCIN) 1,250 mg in sodium chloride 0.9 % 250 mL IVPB     1,250 mg 166.7 mL/hr over 90 Minutes Intravenous Every 12 hours 05/05/17 2141     05/05/17 2215   metroNIDAZOLE (FLAGYL) IVPB 500 mg     500 mg 100 mL/hr over 60 Minutes Intravenous Every 8 hours 05/05/17 2203     05/05/17 2100  vancomycin (VANCOCIN) 2,250 mg in sodium chloride 0.9 % 500 mL IVPB  Status:  Discontinued     2,250 mg 250 mL/hr over 120 Minutes Intravenous  Once 05/05/17 2052 05/05/17 2053   05/05/17 2100  vancomycin (VANCOCIN) 2,000 mg in sodium chloride 0.9 % 500 mL IVPB     2,000 mg 250 mL/hr over 120 Minutes Intravenous  Once 05/05/17 2053 05/05/17 2335       Objective: Physical Exam: Vitals:   05/06/17 1730 05/06/17 2130 05/07/17 0026 05/07/17 0510  BP: (!) 150/66 136/62 (!) 147/64 (!) 158/74  Pulse: 75 80 74 73  Resp: 20 18 18 18   Temp: 98.7 F (37.1 C) 99.9 F (37.7 C) 100 F (37.8 C) 99.3 F (37.4 C)  TempSrc: Oral Oral Oral Oral  SpO2: 92% 94% 94% 95%  Weight:      Height:        Intake/Output Summary (Last 24 hours) at 05/07/17 1740 Last data filed at 05/07/17 1320  Gross per 24 hour  Intake             1560 ml  Output              480 ml  Net             1080 ml   Filed Weights   05/05/17 1711  Weight: 122.5 kg (270 lb)   General:  Alert, Awake and Oriented to Time, Place and Person. Appear in moderate distress, affect appropriate Eyes: PERRL, Conjunctiva normal ENT: Oral Mucosa clear moist. Neck: difficult to assess JVD, no Abnormal Mass Or lumps Cardiovascular: S1 and S2 Present, no Murmur, Peripheral Pulses Present Respiratory: normal respiratory effort, Bilateral Air entry equal and Decreased, no use of accessory muscle, Clear to Auscultation, no Crackles, no wheezes Abdomen: Bowel Sound present, Soft and no tenderness, no hernia Skin: Left great toe swollen, cyanotic, exquisitely tender. Left ring finger surgically absent. Extremities: no Pedal edema, no calf tenderness Neurologic: Grossly no focal neuro deficit. Bilaterally Equal motor strength  Data Reviewed: CBC:  Recent Labs Lab 05/05/17 2041 05/06/17 0444  WBC 17.1* 16.9*   NEUTROABS 12.5* 11.9*  HGB 14.4 12.6*  HCT 40.3 36.9*  MCV 86.9 88.1  PLT 285 291   Basic Metabolic Panel:  Recent Labs Lab 05/05/17 2041 05/06/17 0444  NA 128* 129*  K 4.1 4.0  CL 91* 95*  CO2 27 27  GLUCOSE 356* 229*  BUN 18 16  CREATININE 0.94 0.78  CALCIUM 8.7* 7.9*    Liver Function Tests: No results for input(s): AST, ALT, ALKPHOS, BILITOT, PROT, ALBUMIN in the last 168 hours. No results for input(s): LIPASE, AMYLASE in the last 168 hours. No results for input(s): AMMONIA in the last 168 hours. Coagulation Profile: No results for input(s): INR, PROTIME in the last 168 hours. Cardiac Enzymes: No results for input(s): CKTOTAL, CKMB, CKMBINDEX, TROPONINI in the last 168 hours. BNP (last 3 results) No results for input(s): PROBNP in the last 8760 hours. CBG:  Recent Labs Lab 05/06/17 1854 05/06/17 2124 05/07/17 0615 05/07/17 1200 05/07/17 1712  GLUCAP 263* 259* 210* 250* 240*   Studies: No results found.  Scheduled Meds: . insulin aspart  0-20 Units Subcutaneous TID WC  . insulin aspart  0-5 Units Subcutaneous QHS  . insulin glargine  10 Units Subcutaneous QHS  . protein supplement shake  11 oz Oral BID BM   Continuous Infusions: . metronidazole Stopped (05/07/17 1534)  . vancomycin Stopped (05/07/17 1107)   PRN Meds: acetaminophen **OR** acetaminophen, HYDROcodone-acetaminophen, HYDROmorphone (DILAUDID) injection, metoCLOPramide **OR** metoCLOPramide (REGLAN) injection, morphine injection, ondansetron **OR** ondansetron (ZOFRAN) IV, oxyCODONE  Time spent: 35 minutes  Author: Lynden OxfordPranav Tristen Pennino, MD [facility] Pager: [phone] 05/07/2017 5:40 PM  If 7PM-7AM, please contact night-coverage at [email or web address], password [facility]

## 2017-05-08 ENCOUNTER — Inpatient Hospital Stay (HOSPITAL_COMMUNITY): Payer: 59 | Admitting: Certified Registered Nurse Anesthetist

## 2017-05-08 ENCOUNTER — Encounter (HOSPITAL_COMMUNITY)
Admission: EM | Disposition: A | Discharge status: Home Health | Payer: Self-pay | Source: Home / Self Care | Attending: Internal Medicine

## 2017-05-08 ENCOUNTER — Encounter (HOSPITAL_COMMUNITY): Payer: Self-pay | Admitting: Certified Registered Nurse Anesthetist

## 2017-05-08 HISTORY — PX: AMPUTATION: SHX166

## 2017-05-08 LAB — BASIC METABOLIC PANEL
ANION GAP: 7 (ref 5–15)
BUN: 13 mg/dL (ref 6–20)
CALCIUM: 8.5 mg/dL — AB (ref 8.9–10.3)
CO2: 26 mmol/L (ref 22–32)
Chloride: 99 mmol/L — ABNORMAL LOW (ref 101–111)
Creatinine, Ser: 0.65 mg/dL (ref 0.61–1.24)
GFR calc Af Amer: >60 mL/min (ref >60)
GLUCOSE: 146 mg/dL — AB (ref 65–99)
Potassium: 4.1 mmol/L (ref 3.5–5.1)
Sodium: 132 mmol/L — ABNORMAL LOW (ref 135–145)

## 2017-05-08 LAB — GLUCOSE, CAPILLARY
GLUCOSE-CAPILLARY: 136 mg/dL — AB (ref 65–99)
GLUCOSE-CAPILLARY: 139 mg/dL — AB (ref 65–99)
Glucose-Capillary: 168 mg/dL — ABNORMAL HIGH (ref 65–99)
Glucose-Capillary: 127 mg/dL — ABNORMAL HIGH (ref 65–99)
Glucose-Capillary: 209 mg/dL — ABNORMAL HIGH (ref 65–99)

## 2017-05-08 LAB — VANCOMYCIN, TROUGH: Vancomycin Tr: 8 ug/mL — ABNORMAL LOW (ref 15–20)

## 2017-05-08 LAB — CBC
HCT: 39.8 % (ref 39.0–52.0)
Hemoglobin: 13.5 g/dL (ref 13.0–17.0)
MCH: 30.3 pg (ref 26.0–34.0)
MCHC: 33.9 g/dL (ref 30.0–36.0)
MCV: 89.2 fL (ref 78.0–100.0)
Platelets: 340 10*3/uL (ref 150–400)
RBC: 4.46 MIL/uL (ref 4.22–5.81)
RDW: 12.9 % (ref 11.5–15.5)
WBC: 15.6 10*3/uL — AB (ref 4.0–10.5)

## 2017-05-08 LAB — SURGICAL PCR SCREEN
MRSA, PCR: NEGATIVE
Staphylococcus aureus: POSITIVE — AB

## 2017-05-08 LAB — HEMOGLOBIN A1C
Hgb A1c MFr Bld: 13.4 % — ABNORMAL HIGH (ref 4.8–5.6)
MEAN PLASMA GLUCOSE: 338 mg/dL

## 2017-05-08 LAB — MAGNESIUM: Magnesium: 1.6 mg/dL — ABNORMAL LOW (ref 1.7–2.4)

## 2017-05-08 SURGERY — AMPUTATION, FOOT, RAY
Anesthesia: General | Site: Fifth Toe | Laterality: Left

## 2017-05-08 MED ORDER — FENTANYL CITRATE (PF) 250 MCG/5ML IJ SOLN
INTRAMUSCULAR | Status: DC | PRN
  Administered 2017-05-08: 100 ug via INTRAVENOUS
  Administered 2017-05-08: 50 ug via INTRAVENOUS
  Administered 2017-05-08 (×2): 25 ug via INTRAVENOUS
  Administered 2017-05-08: 50 ug via INTRAVENOUS

## 2017-05-08 MED ORDER — MAGNESIUM CITRATE PO SOLN
1.0000 | Freq: Once | ORAL | Status: AC | PRN
  Administered 2017-05-09: 1 via ORAL
  Filled 2017-05-08: qty 296

## 2017-05-08 MED ORDER — METOCLOPRAMIDE HCL 5 MG/ML IJ SOLN: 5.0000 mg | Freq: Three times a day (TID) | INTRAMUSCULAR | Status: DC | PRN

## 2017-05-08 MED ORDER — POLYETHYLENE GLYCOL 3350 17 G PO PACK: 17.0000 g | PACK | Freq: Every day | ORAL | Status: DC | PRN

## 2017-05-08 MED ORDER — MIDAZOLAM HCL 2 MG/2ML IJ SOLN
INTRAMUSCULAR | Status: DC | PRN
  Administered 2017-05-08 (×2): 1 mg via INTRAVENOUS

## 2017-05-08 MED ORDER — METOCLOPRAMIDE HCL 5 MG/ML IJ SOLN: 10.0000 mg | Freq: Once | INTRAMUSCULAR | Status: DC | PRN

## 2017-05-08 MED ORDER — ACETAMINOPHEN 325 MG PO TABS: 650.0000 mg | ORAL_TABLET | Freq: Four times a day (QID) | ORAL | Status: DC | PRN

## 2017-05-08 MED ORDER — FENTANYL CITRATE (PF) 100 MCG/2ML IJ SOLN
25.0000 ug | INTRAMUSCULAR | Status: DC | PRN
  Administered 2017-05-08 (×2): 50 ug via INTRAVENOUS

## 2017-05-08 MED ORDER — LIDOCAINE HCL (CARDIAC) 20 MG/ML IV SOLN
INTRAVENOUS | Status: DC | PRN
  Administered 2017-05-08: 60 mg via INTRATRACHEAL

## 2017-05-08 MED ORDER — VANCOMYCIN HCL IN DEXTROSE 1-5 GM/200ML-% IV SOLN
1000.0000 mg | Freq: Three times a day (TID) | INTRAVENOUS | Status: DC
  Administered 2017-05-08 – 2017-05-09 (×3): 1000 mg via INTRAVENOUS
  Filled 2017-05-08 (×4): qty 200

## 2017-05-08 MED ORDER — LACTATED RINGERS IV SOLN
INTRAVENOUS | Status: DC | PRN
  Administered 2017-05-08: 16:00:00 via INTRAVENOUS

## 2017-05-08 MED ORDER — 0.9 % SODIUM CHLORIDE (POUR BTL) OPTIME
TOPICAL | Status: DC | PRN
  Administered 2017-05-08: 1000 mL

## 2017-05-08 MED ORDER — METOCLOPRAMIDE HCL 5 MG PO TABS: 5.0000 mg | ORAL_TABLET | Freq: Three times a day (TID) | ORAL | Status: DC | PRN

## 2017-05-08 MED ORDER — ONDANSETRON HCL 4 MG/2ML IJ SOLN
INTRAMUSCULAR | Status: AC
  Filled 2017-05-08: qty 2

## 2017-05-08 MED ORDER — DOCUSATE SODIUM 100 MG PO CAPS
100.0000 mg | ORAL_CAPSULE | Freq: Two times a day (BID) | ORAL | Status: DC
  Administered 2017-05-08 – 2017-05-09 (×2): 100 mg via ORAL
  Filled 2017-05-08 (×2): qty 1

## 2017-05-08 MED ORDER — FENTANYL CITRATE (PF) 100 MCG/2ML IJ SOLN
INTRAMUSCULAR | Status: AC
  Filled 2017-05-08: qty 2

## 2017-05-08 MED ORDER — MEPERIDINE HCL 25 MG/ML IJ SOLN: 6.2500 mg | INTRAMUSCULAR | Status: DC | PRN

## 2017-05-08 MED ORDER — LIDOCAINE 2% (20 MG/ML) 5 ML SYRINGE
INTRAMUSCULAR | Status: AC
  Filled 2017-05-08: qty 5

## 2017-05-08 MED ORDER — ONDANSETRON HCL 4 MG PO TABS: 4.0000 mg | ORAL_TABLET | Freq: Four times a day (QID) | ORAL | Status: DC | PRN

## 2017-05-08 MED ORDER — ONDANSETRON HCL 4 MG/2ML IJ SOLN
INTRAMUSCULAR | Status: DC | PRN
  Administered 2017-05-08: 4 mg via INTRAVENOUS

## 2017-05-08 MED ORDER — SODIUM CHLORIDE 0.9 % IV SOLN
INTRAVENOUS | Status: DC
  Administered 2017-05-08: 18:00:00 via INTRAVENOUS

## 2017-05-08 MED ORDER — LACTATED RINGERS IV SOLN: INTRAVENOUS | Status: DC

## 2017-05-08 MED ORDER — PROPOFOL 10 MG/ML IV BOLUS
INTRAVENOUS | Status: AC
  Filled 2017-05-08: qty 20

## 2017-05-08 MED ORDER — PROPOFOL 10 MG/ML IV BOLUS
INTRAVENOUS | Status: DC | PRN
  Administered 2017-05-08: 20 mg via INTRAVENOUS
  Administered 2017-05-08: 30 mg via INTRAVENOUS
  Administered 2017-05-08: 150 mg via INTRAVENOUS

## 2017-05-08 MED ORDER — BISACODYL 10 MG RE SUPP: 10.0000 mg | Freq: Every day | RECTAL | Status: DC | PRN

## 2017-05-08 MED ORDER — METHOCARBAMOL 1000 MG/10ML IJ SOLN: 500.0000 mg | Freq: Four times a day (QID) | INTRAVENOUS | Status: DC | PRN

## 2017-05-08 MED ORDER — GLYCOPYRROLATE 0.2 MG/ML IJ SOLN
INTRAMUSCULAR | Status: DC | PRN
Start: 2017-05-08 — End: 2017-05-08
  Administered 2017-05-08: 0.2 mg via INTRAVENOUS

## 2017-05-08 MED ORDER — HYDROMORPHONE HCL 1 MG/ML IJ SOLN: 1.0000 mg | INTRAMUSCULAR | Status: DC | PRN

## 2017-05-08 MED ORDER — ONDANSETRON HCL 4 MG/2ML IJ SOLN: 4.0000 mg | Freq: Four times a day (QID) | INTRAMUSCULAR | Status: DC | PRN

## 2017-05-08 MED ORDER — FENTANYL CITRATE (PF) 250 MCG/5ML IJ SOLN
INTRAMUSCULAR | Status: AC
  Filled 2017-05-08: qty 5

## 2017-05-08 MED ORDER — ACETAMINOPHEN 650 MG RE SUPP: 650.0000 mg | Freq: Four times a day (QID) | RECTAL | Status: DC | PRN

## 2017-05-08 MED ORDER — MIDAZOLAM HCL 2 MG/2ML IJ SOLN
INTRAMUSCULAR | Status: AC
  Filled 2017-05-08: qty 2

## 2017-05-08 MED ORDER — METHOCARBAMOL 500 MG PO TABS
500.0000 mg | ORAL_TABLET | Freq: Four times a day (QID) | ORAL | Status: DC | PRN
  Administered 2017-05-09 (×2): 500 mg via ORAL
  Filled 2017-05-08 (×2): qty 1

## 2017-05-08 MED ORDER — OXYCODONE HCL 5 MG PO TABS
5.0000 mg | ORAL_TABLET | ORAL | Status: DC | PRN
  Administered 2017-05-08 – 2017-05-09 (×5): 10 mg via ORAL
  Filled 2017-05-08 (×5): qty 2

## 2017-05-08 SURGICAL SUPPLY — 27 items
BLADE SAW SGTL MED 73X18.5 STR (BLADE) IMPLANT
BLADE SURG 21 STRL SS (BLADE) ×3 IMPLANT
BNDG COHESIVE 4X5 TAN STRL (GAUZE/BANDAGES/DRESSINGS) IMPLANT
BNDG GAUZE ELAST 4 BULKY (GAUZE/BANDAGES/DRESSINGS) IMPLANT
COVER SURGICAL LIGHT HANDLE (MISCELLANEOUS) ×6 IMPLANT
DRAPE U-SHAPE 47X51 STRL (DRAPES) ×6 IMPLANT
DRSG ADAPTIC 3X8 NADH LF (GAUZE/BANDAGES/DRESSINGS) IMPLANT
DRSG PAD ABDOMINAL 8X10 ST (GAUZE/BANDAGES/DRESSINGS) IMPLANT
DURAPREP 26ML APPLICATOR (WOUND CARE) ×3 IMPLANT
ELECT REM PT RETURN 9FT ADLT (ELECTROSURGICAL) ×3
ELECTRODE REM PT RTRN 9FT ADLT (ELECTROSURGICAL) ×1 IMPLANT
GAUZE SPONGE 4X4 12PLY STRL (GAUZE/BANDAGES/DRESSINGS) IMPLANT
GLOVE BIOGEL PI IND STRL 9 (GLOVE) ×1 IMPLANT
GLOVE BIOGEL PI INDICATOR 9 (GLOVE) ×2
GLOVE SURG ORTHO 9.0 STRL STRW (GLOVE) ×3 IMPLANT
GOWN STRL REUS W/ TWL XL LVL3 (GOWN DISPOSABLE) ×2 IMPLANT
GOWN STRL REUS W/TWL XL LVL3 (GOWN DISPOSABLE) ×4
KIT BASIN OR (CUSTOM PROCEDURE TRAY) ×3 IMPLANT
KIT PREVENA INCISION MGT20CM45 (CANNISTER) ×3 IMPLANT
KIT ROOM TURNOVER OR (KITS) ×3 IMPLANT
NS IRRIG 1000ML POUR BTL (IV SOLUTION) ×3 IMPLANT
PACK ORTHO EXTREMITY (CUSTOM PROCEDURE TRAY) ×3 IMPLANT
PAD ARMBOARD 7.5X6 YLW CONV (MISCELLANEOUS) ×6 IMPLANT
PREVENA INCISION MGT 90 150 (MISCELLANEOUS) ×3 IMPLANT
STOCKINETTE IMPERVIOUS LG (DRAPES) IMPLANT
SUT ETHILON 2 0 PSLX (SUTURE) ×6 IMPLANT
TOWEL OR 17X26 10 PK STRL BLUE (TOWEL DISPOSABLE) ×3 IMPLANT

## 2017-05-08 NOTE — Progress Notes (Signed)
Inpatient Diabetes Program Recommendations  AACE/ADA: New Consensus Statement on Inpatient Glycemic Control (2015)  Target Ranges:  Prepandial:   less than 140 mg/dL      Peak postprandial:   less than 180 mg/dL (1-2 hours)      Critically ill patients:  140 - 180 mg/dL   Lab Results  Component Value Date   GLUCAP 139 (H) 05/08/2017   HGBA1C 13.4 (H) 05/07/2017    Review of Glycemic Control  Diabetes history: New Diagnosis this admission  Spoke with patient about new diabetes diagnosis.  Briefly discussed A1C results. Discussed basic pathophysiology of DM Type 2. Patient told me he wanted to start learning about DM after his surgery and he thought I would come by after. Explained to patient I may not be available after his surgery after he got back to the floor. Spoke with patient about starting to review our DM videos and looking through the living well with DM booklet while here. Discussed possible outpatient education and patient really liked that option. Asked patient if DM ran in his family he said he did not know what ran in his family. They never talked about medical issues. Spoke with patient about his A1c being high enough to possibly be on inuslin at d/c.   RNs to provide ongoing basic DM education at bedside with this patient and engage patient to actively check blood glucose and administer insulin injections if patient is willing.   Thanks, Christena DeemShannon Catrena Vari RN, MSN, [facility] Inpatient Diabetes Coordinator Team Pager [phone] (8a-5p)

## 2017-05-08 NOTE — Progress Notes (Signed)
Pharmacy Antibiotic Note  Russell Merritt is a 51 y.o. male admitted on 05/05/2017 with osteomyelitis.  Pharmacy has been consulted for vancomycin dosing.  Vancomycin trough is low at 8 on 1250mg  IV every 12 hours. Goal trough is 15-20 for osteo. Trough was drawn at 10 AM - 1 hr late. True trough ~ WBC down some at 15.6. Afebrile.   Plan: Change Vancomycin to 1g IV every 8 hours Monitor renal function, clinical status, and culture results   Height: 6' (182.9 cm) Weight: 270 lb (122.5 kg) IBW/kg (Calculated) : 77.6  Temp (24hrs), Avg:98.5 F (36.9 C), Min:98.3 F (36.8 C), Max:98.9 F (37.2 C)   Recent Labs Lab 05/05/17 2041 05/05/17 2305 05/06/17 0444 05/08/17 1000  WBC 17.1*  --  16.9* 15.6*  CREATININE 0.94  --  0.78 0.65  LATICACIDVEN  --  1.1 0.8  --   VANCOTROUGH  --   --   --  8*    Estimated Creatinine Clearance: 147.7 mL/min (by C-G formula based on SCr of 0.65 mg/dL).    Allergies  Allergen Reactions  . No Known Allergies     Antimicrobials this admission: 6/5 vancomycin>> 6/5 Flagyl >>  Dose adjustments this admission:   Microbiology results: 6/5 wound Cx (superficial): mod GPC in pairs/chains, few GNR 6/5 BCx: sent 6/6 wound Cx (superficial): few GPC in pairs, rare GN coccobacilli  Thank you for allowing pharmacy to be a part of this patient's care.  Russell Merritt, Russell Merritt 05/08/2017 11:27 AM

## 2017-05-08 NOTE — Anesthesia Procedure Notes (Signed)
Procedure Name: LMA Insertion Date/Time: 05/08/2017 4:04 PM Performed by: Gaynelle AduFITZGERALD, Barbera Perritt Pre-anesthesia Checklist: Patient identified, Emergency Drugs available, Suction available and Patient being monitored Patient Re-evaluated:Patient Re-evaluated prior to inductionOxygen Delivery Method: Circle system utilized Preoxygenation: Pre-oxygenation with 100% oxygen Intubation Type: IV induction Ventilation: Mask ventilation without difficulty and Nasal airway inserted- appropriate to patient size LMA: LMA inserted LMA Size: 5.0 Number of attempts: 1 Placement Confirmation: positive ETCO2 and breath sounds checked- equal and bilateral Tube secured with: Tape Dental Injury: Teeth and Oropharynx as per pre-operative assessment

## 2017-05-08 NOTE — Progress Notes (Signed)
Orthopedic Tech Progress Note Patient Details:  Russell NineKevin W Merritt [birth year] [id number]  Ortho Devices Type of Ortho Device: Postop shoe/boot Ortho Device/Splint Location: fitted pt for post op shoe on left foot.  post op shoe placed at bedside for later use.  left foot. Ortho Device/Splint Interventions: Application   Alvina ChouWilliams, Trenita Hulme C 05/08/2017, 8:49 PM

## 2017-05-08 NOTE — Anesthesia Preprocedure Evaluation (Addendum)
Anesthesia Evaluation  Patient identified by MRN, date of birth, ID band Patient awake    Reviewed: Allergy & Precautions, NPO status , Patient's Chart, lab work & pertinent test results  Airway Mallampati: II  TM Distance: >3 FB Neck ROM: Full    Dental no notable dental hx.    Pulmonary neg pulmonary ROS, former smoker,    Pulmonary exam normal breath sounds clear to auscultation       Cardiovascular negative cardio ROS Normal cardiovascular exam Rhythm:Regular Rate:Normal     Neuro/Psych negative neurological ROS  negative psych ROS   GI/Hepatic negative GI ROS, Neg liver ROS,   Endo/Other  diabetes  Renal/GU negative Renal ROS  negative genitourinary   Musculoskeletal negative musculoskeletal ROS (+)   Abdominal   Peds negative pediatric ROS (+)  Hematology negative hematology ROS (+)   Anesthesia Other Findings   Reproductive/Obstetrics negative OB ROS                             Anesthesia Physical Anesthesia Plan  ASA: III and emergent  Anesthesia Plan: General   Post-op Pain Management:    Induction: Intravenous  PONV Risk Score and Plan: 1 and Ondansetron and Treatment may vary due to age  Airway Management Planned: LMA and Oral ETT  Additional Equipment:   Intra-op Plan:   Post-operative Plan: Extubation in OR  Informed Consent: I have reviewed the patients History and Physical, chart, labs and discussed the procedure including the risks, benefits and alternatives for the proposed anesthesia with the patient or authorized representative who has indicated his/her understanding and acceptance.   Dental advisory given  Plan Discussed with: CRNA and Surgeon  Anesthesia Plan Comments:         Anesthesia Quick Evaluation  

## 2017-05-08 NOTE — Transfer of Care (Signed)
Immediate Anesthesia Transfer of Care Note  Patient: Russell Merritt  Procedure(s) Performed: Procedure(s): Left 1st Ray Amputation (Left)  Patient Location: PACU  Anesthesia Type:General  Level of Consciousness: awake, alert  and patient cooperative  Airway & Oxygen Therapy: Patient Spontanous Breathing and Patient connected to nasal cannula oxygen  Post-op Assessment: Report given to RN, Post -op Vital signs reviewed and stable, Patient moving all extremities X 4 and Patient able to stick tongue midline  Post vital signs: Reviewed and stable  Last Vitals:  Vitals:   05/08/17 0623 05/08/17 1300  BP: (!) 165/78 (!) 163/78  Pulse: 67 64  Resp: 18 18  Temp: 36.8 C 36.7 C    Last Pain:  Vitals:   05/08/17 1308  TempSrc:   PainSc: 3       Patients Stated Pain Goal: 4 (05/07/17 2057)  Complications: No apparent anesthesia complications

## 2017-05-08 NOTE — Interval H&P Note (Signed)
History and Physical Interval Note:  05/08/2017 6:50 AM  [name]  has presented today for surgery, with the diagnosis of Osteomyelitis 1st Metatarsal Left Foot  The various methods of treatment have been discussed with the patient and family. After consideration of risks, benefits and other options for treatment, the patient has consented to  Procedure(s): Left 1st Ray Amputation, Possible Amputation 2nd Toe (Left) as a surgical intervention .  The patient's history has been reviewed, patient examined, no change in status, stable for surgery.  I have reviewed the patient's chart and labs.  Questions were answered to the patient's satisfaction.     Nadara MustardMarcus V Lyndee Herbst

## 2017-05-08 NOTE — Op Note (Signed)
05/05/2017 - 05/08/2017  4:24 PM  PATIENT:  Russell Merritt    PRE-OPERATIVE DIAGNOSIS:  Osteomyelitis 1st Metatarsal Left Foot  POST-OPERATIVE DIAGNOSIS:  Same  PROCEDURE:  Left 1st Ray Amputation, Application Prevena wound VAC Local tissue rearrangement for wound closure 3 x 7 cm.  SURGEON:  Nadara MustardMarcus V Duda, MD  PHYSICIAN ASSISTANT:None ANESTHESIA:   General  PREOPERATIVE INDICATIONS:  Russell Merritt is a  51 y.o. male with a diagnosis of Osteomyelitis 1st Metatarsal Left Foot who failed conservative measures and elected for surgical management.    The risks benefits and alternatives were discussed with the patient preoperatively including but not limited to the risks of infection, bleeding, nerve injury, cardiopulmonary complications, the need for revision surgery, among others, and the patient was willing to proceed.  OPERATIVE IMPLANTS: Prevena wound VAC  OPERATIVE FINDINGS: Proximally good petechial bleeding distally minimal petechial bleeding  OPERATIVE PROCEDURE: Patient was brought to the operating room and underwent a general anesthetic. After adequate levels anesthesia were obtained patient's left lower extremity was prepped using DuraPrep draped into a sterile field a timeout was called. A racquet incision was made around the ulcerative tissue and the first metatarsal head. The first ray was resected through the base. Electrocautery was used for hemostasis. The wound was irrigated with normal saline. Local tissue rearrangement was used for wound closure 3 x 7 cm. A Prevena wound VAC was applied patient was extubated taken to the PACU in stable condition.

## 2017-05-08 NOTE — Anesthesia Postprocedure Evaluation (Signed)
Anesthesia Post Note  Patient: Russell Merritt  Procedure(s) Performed: Procedure(s) (LRB): Left 1st Ray Amputation (Left)     Patient location during evaluation: PACU Anesthesia Type: General Level of consciousness: awake and alert Pain management: pain level controlled Vital Signs Assessment: post-procedure vital signs reviewed and stable Respiratory status: spontaneous breathing, nonlabored ventilation and respiratory function stable Cardiovascular status: blood pressure returned to baseline and stable Postop Assessment: no signs of nausea or vomiting Anesthetic complications: no    Last Vitals:  Vitals:   05/08/17 1715 05/08/17 1730  BP: (!) 148/83 (!) 142/80  Pulse: 67 77  Resp: 11 16  Temp:      Last Pain:  Vitals:   05/08/17 1730  TempSrc:   PainSc: 0-No pain                 Johna Kearl,W. EDMOND

## 2017-05-08 NOTE — H&P (View-Only) (Signed)
ORTHOPAEDIC CONSULTATION  REQUESTING PHYSICIAN: Rhetta MuraSamtani, Jai-Gurmukh, MD  Chief Complaint:   HPI: Russell Merritt is a 51 y.o. male who presents with recently diagnosed diabetic who has had a one-month history of ulceration of the left great toe. Patient states that he has been to podiatry twice in the second time a recommended him proceeding to the emergency room for evaluation and treatment. Patient was initially seen by orthopedics, Dr. Ranell PatrickNorris, and underwent emergent amputation of the great toe at the MTP joint and patient is seen now in follow-up for definitive treatment and foot salvage.  History reviewed. No pertinent past medical history. Past Surgical History:  Procedure Laterality Date  . HAND SURGERY Left   . TONSILLECTOMY     Social History   Social History  . Marital status: Divorced    Spouse name: N/A  . Number of children: N/A  . Years of education: N/A   Social History Main Topics  . Smoking status: Former Games developermoker  . Smokeless tobacco: Never Used  . Alcohol use No  . Drug use: No  . Sexual activity: Not Asked   Other Topics Concern  . None   Social History Narrative  . None   Family History  Problem Relation Age of Onset  . Family history unknown: Yes   - negative except otherwise stated in the family history section No Known Allergies Prior to Admission medications   Medication Sig Start Date End Date Taking? Authorizing Provider  amoxicillin-clavulanate (AUGMENTIN) 875-125 MG tablet Take 1 tablet by mouth 2 (two) times daily. 05/04/17  Yes Merritt, Russell O, DPM  ibuprofen (ADVIL,MOTRIN) 200 MG tablet Take 800 mg by mouth every 8 (eight) hours as needed for moderate pain.    Yes [provider]  MULTIPLE VITAMIN PO Take 1 tablet by mouth daily.    Yes [provider]  clindamycin (CLEOCIN T) 1 % external solution Apply 1 application topically 2 (two) times daily.  05/05/17   [provider]   No results found. - pertinent  xrays, CT, MRI studies were reviewed and independently interpreted  Positive ROS: All other systems have been reviewed and were otherwise negative with the exception of those mentioned in the HPI and as above.  Physical Exam: General: Alert, no acute distress Psychiatric: Patient is competent for consent with normal mood and affect Lymphatic: No axillary or cervical lymphadenopathy Cardiovascular: No pedal edema Respiratory: No cyanosis, no use of accessory musculature GI: No organomegaly, abdomen is soft and non-tender  Skin: Patient has an open wound over the MTP joint with amputation of the great toe. There is ischemic changes to the soft tissue as well as ischemic changes along the border of the second toe.   Neurologic: Patient does not have protective sensation bilateral lower extremities.   MUSCULOSKELETAL:  On examination patient has a good dorsalis pedis pulse he has good hair growth in his leg. No venous stasis ulcers. He has an open wound at the MTP joint with no abscess no purulent drainage.  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  Plan: Plan: We'll plan for a first ray amputation possible second toe amputation. Risk and benefits were discussed including risk of the wound not healing. Patient works in [employer] and discussed that he would need to be off his foot for at least a month postoperatively. I'll order a hemoglobin A1c  Thank you for  the consult and the opportunity to see [name], MD [facility] [phone] 6:46 AM

## 2017-05-08 NOTE — Progress Notes (Signed)
[facility] Progress Note  Patient: Russell Merritt ZOX:[id number]   PCP: Patient, No Pcp Per DOB: 1966/04/10   DOA: 05/05/2017   DOS: 05/08/2017   Date of Service: the patient was seen and examined on 05/08/2017  Subjective: Pain is still present. No nausea no vomiting. No acute complaint. No acute events.  Brief hospital course: Pt. with PMH of diabetes undiagnosed.; admitted on 05/05/2017, presented with complaint of foot infection, was found to have osteomyelitis of the left toe. Currently further plan is scheduled amputation.  Assessment and Plan: 1. 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 - Continue to follow cultures, continue empiric abx with vancomycin and Flagyl, provide supportive care with IVF, antipyretics, analgesia    2. Diabetes mellitus, new diagnosis uncontrolled with hyperglycemia - Pt noted to have random CBG in 443 at urgent care day prior to admission  - Serum glucose is 356 on presentation here  - Significantly elevated A1c, -continue sliding scale  3. Hyponatremia  - Resolved, pseudohyponatremia due to hyperglycemia  Diet: carb modified DVT Prophylaxis: subcutaneous Heparin  Advance goals of care discussion: full code  Family Communication: no family was present at bedside, at the time of interview.  Disposition:  Discharge to be determined .  Consultants: orthopedics Procedures: " Left first ray amputation with wound VAC placement  Antibiotics: Anti-infectives    Start     Dose/Rate Route Frequency Ordered Stop   05/08/17 1136  vancomycin (VANCOCIN) IVPB 1000 mg/200 mL premix     1,000 mg 200 mL/hr over 60 Minutes  Intravenous Every 8 hours 05/08/17 1137     05/08/17 0700  ceFAZolin (ANCEF) 3 g in dextrose 5 % 50 mL IVPB     3 g 130 mL/hr over 30 Minutes Intravenous To [facility]Stay [facility] 05/07/17 1949 05/08/17 1815   05/06/17 1000  vancomycin (VANCOCIN) 1,250 mg in sodium chloride 0.9 % 250 mL IVPB  Status:  Discontinued     1,250 mg 166.7 mL/hr over 90 Minutes Intravenous Every 12 hours 05/05/17 2141 05/08/17 1137   05/05/17 2215  metroNIDAZOLE (FLAGYL) IVPB 500 mg     500 mg 100 mL/hr over 60 Minutes Intravenous Every 8 hours 05/05/17 2203     05/05/17 2100  vancomycin (VANCOCIN) 2,250 mg in sodium chloride 0.9 % 500 mL IVPB  Status:  Discontinued     2,250 mg 250 mL/hr over 120 Minutes Intravenous  Once 05/05/17 2052 05/05/17 2053   05/05/17 2100  vancomycin (VANCOCIN) 2,000 mg in sodium chloride 0.9 % 500 mL IVPB     2,000 mg 250 mL/hr over 120 Minutes Intravenous  Once 05/05/17 2053 05/05/17 2335       Objective: Physical Exam: Vitals:   05/08/17 1700 05/08/17 1715 05/08/17 1730 05/08/17 1745  BP: (!) 155/93 (!) 148/83 (!) 142/80 (!) 145/82  Pulse: 81 67 77 70  Resp: 11 11 16 16   Temp:    98 F (36.7 C)  TempSrc:      SpO2: 98% 92% 96% 96%  Weight:      Height:        Intake/Output Summary (Last 24 hours) at 05/08/17 1818 Last data filed at 05/08/17 1638  Gross per 24 hour  Intake              700 ml  Output             1200 ml  Net             -500 ml   Filed Weights   05/05/17 1711 05/08/17 1451  Weight: 122.5 kg (270 lb) 122.5 kg (270 lb)   General: Alert, Awake and Oriented to Time, Place and Person. Appear in mild distress, affect appropriate ENT: Oral Mucosa clear moist. Neck: difficult to assess JVD, no Abnormal Mass Or lumps Cardiovascular: S1 and S2 Present, no Murmur, Peripheral Pulses Present Respiratory: normal respiratory effort, Bilateral Air entry equal and Decreased, n use of accessory muscle, Clear to Auscultation, no Crackles, no wheezes Abdomen: Bowel  Sound present, Soft and no tenderness, no hernia Skin: left redness, no Rash, left leg induration Extremities: no Pedal edema, no calf tenderness Neurologic: Grossly no focal neuro deficit. Bilaterally Equal motor strength  Data Reviewed: CBC:  Recent Labs Lab 05/05/17 2041 05/06/17 0444 05/08/17 1000  WBC 17.1* 16.9* 15.6*  NEUTROABS 12.5* 11.9*  --   HGB 14.4 12.6* 13.5  HCT 40.3 36.9* 39.8  MCV 86.9 88.1 89.2  PLT 285 291 340   Basic Metabolic Panel:  Recent Labs Lab 05/05/17 2041 05/06/17 0444 05/08/17 1000  NA 128* 129* 132*  K 4.1 4.0 4.1  CL 91* 95* 99*  CO2 27 27 26   GLUCOSE 356* 229* 146*  BUN 18 16 13   CREATININE 0.94 0.78 0.65  CALCIUM 8.7* 7.9* 8.5*  MG  --   --  1.6*    Liver Function Tests: No results for input(s): AST, ALT, ALKPHOS, BILITOT, PROT, ALBUMIN in the last 168 hours. No results for input(s): LIPASE, AMYLASE in the last 168 hours. No results for input(s): AMMONIA in the last 168 hours. Coagulation Profile: No results for input(s): INR, PROTIME in the last 168 hours. Cardiac Enzymes: No results for input(s): CKTOTAL, CKMB, CKMBINDEX, TROPONINI in the last 168 hours. BNP (last 3 results) No results for input(s): PROBNP in the last 8760 hours. CBG:  Recent Labs Lab 05/07/17 2152 05/08/17 0632 05/08/17 1156 05/08/17 1458 05/08/17 1649  GLUCAP 250* 168* 136* 139* 127*   Studies: No results found.  Scheduled Meds: . docusate sodium  100 mg Oral BID  . insulin aspart  0-20 Units Subcutaneous TID WC  . insulin aspart  0-5 Units Subcutaneous QHS  . insulin glargine  10 Units Subcutaneous QHS  . protein supplement shake  11 oz Oral BID BM   Continuous Infusions: . sodium chloride    . methocarbamol (ROBAXIN)  IV    . metronidazole Stopped (05/08/17 1816)  . vancomycin Stopped (05/08/17 1318)   PRN Meds: acetaminophen **OR** acetaminophen, bisacodyl, HYDROcodone-acetaminophen, HYDROmorphone (DILAUDID) injection, magnesium  citrate, methocarbamol **OR** methocarbamol (ROBAXIN)  IV, metoCLOPramide **OR** metoCLOPramide (REGLAN) injection, morphine injection, ondansetron **OR** ondansetron (ZOFRAN) IV, oxyCODONE, polyethylene glycol  Time spent: 35 minutes  Author: Lynden OxfordPranav Shandale Malak, MD [facility] Pager: [phone] 05/08/2017 6:18 PM  If 7PM-7AM, please contact night-coverage at [email or web address], password [email or web address]

## 2017-05-08 NOTE — Anesthesia Postprocedure Evaluation (Signed)
Anesthesia Post Note  Patient: Russell Merritt  Procedure(s) Performed: Procedure(s) (LRB): Left 1st Ray Amputation (Left)     Anesthesia Post Evaluation  Last Vitals:  Vitals:   05/08/17 1700 05/08/17 1715  BP: (!) 155/93 (!) 148/83  Pulse: 81 67  Resp: 11 11  Temp:      Last Pain:  Vitals:   05/08/17 1730  TempSrc:   PainSc: 0-No pain                 Berlene Dixson,W. EDMOND

## 2017-05-09 ENCOUNTER — Encounter (HOSPITAL_COMMUNITY): Payer: Self-pay | Admitting: Orthopedic Surgery

## 2017-05-09 LAB — AEROBIC CULTURE W GRAM STAIN (SUPERFICIAL SPECIMEN)

## 2017-05-09 LAB — CBC
HCT: 40.3 % (ref 39.0–52.0)
Hemoglobin: 12.8 g/dL — ABNORMAL LOW (ref 13.0–17.0)
MCH: 29.4 pg (ref 26.0–34.0)
MCHC: 31.8 g/dL (ref 30.0–36.0)
MCV: 92.4 fL (ref 78.0–100.0)
Platelets: 391 10*3/uL (ref 150–400)
RBC: 4.36 MIL/uL (ref 4.22–5.81)
RDW: 13.1 % (ref 11.5–15.5)
WBC: 17 10*3/uL — ABNORMAL HIGH (ref 4.0–10.5)

## 2017-05-09 LAB — BASIC METABOLIC PANEL
Anion gap: 8 (ref 5–15)
BUN: 10 mg/dL (ref 6–20)
CO2: 29 mmol/L (ref 22–32)
Calcium: 8.3 mg/dL — ABNORMAL LOW (ref 8.9–10.3)
Chloride: 93 mmol/L — ABNORMAL LOW (ref 101–111)
Creatinine, Ser: 0.8 mg/dL (ref 0.61–1.24)
GFR calc Af Amer: >60 mL/min (ref >60)
GFR calc non Af Amer: >60 mL/min (ref >60)
Glucose, Bld: 180 mg/dL — ABNORMAL HIGH (ref 65–99)
Potassium: 4.8 mmol/L (ref 3.5–5.1)
Sodium: 130 mmol/L — ABNORMAL LOW (ref 135–145)

## 2017-05-09 LAB — GLUCOSE, CAPILLARY
Glucose-Capillary: 166 mg/dL — ABNORMAL HIGH (ref 65–99)
Glucose-Capillary: 172 mg/dL — ABNORMAL HIGH (ref 65–99)

## 2017-05-09 LAB — AEROBIC CULTURE  (SUPERFICIAL SPECIMEN)

## 2017-05-09 MED ORDER — INSULIN PEN NEEDLE 30G X 5 MM MISC: 1.0000 | Freq: Three times a day (TID) | 0 refills | Status: DC

## 2017-05-09 MED ORDER — DOCUSATE SODIUM 100 MG PO CAPS: 100.0000 mg | ORAL_CAPSULE | Freq: Two times a day (BID) | ORAL | 0 refills | Status: DC | PRN

## 2017-05-09 MED ORDER — INSULIN ASPART 100 UNIT/ML FLEXPEN: PEN_INJECTOR | SUBCUTANEOUS | 0 refills | Status: DC

## 2017-05-09 MED ORDER — POLYETHYLENE GLYCOL 3350 17 G PO PACK: 17.0000 g | PACK | Freq: Every day | ORAL | 0 refills | Status: DC | PRN

## 2017-05-09 MED ORDER — SACCHAROMYCES BOULARDII 250 MG PO CAPS
250.0000 mg | ORAL_CAPSULE | Freq: Two times a day (BID) | ORAL | Status: DC
  Administered 2017-05-09: 250 mg via ORAL
  Filled 2017-05-09: qty 1

## 2017-05-09 MED ORDER — HYDROCODONE-ACETAMINOPHEN 5-325 MG PO TABS: 1.0000 | ORAL_TABLET | Freq: Four times a day (QID) | ORAL | 0 refills | Status: DC | PRN

## 2017-05-09 MED ORDER — ACETAMINOPHEN 325 MG PO TABS: 650.0000 mg | ORAL_TABLET | Freq: Four times a day (QID) | ORAL | Status: AC | PRN

## 2017-05-09 MED ORDER — SACCHAROMYCES BOULARDII 250 MG PO CAPS: 250.0000 mg | ORAL_CAPSULE | Freq: Two times a day (BID) | ORAL | 0 refills | Status: DC

## 2017-05-09 MED ORDER — DOXYCYCLINE HYCLATE 100 MG PO TABS: 100.0000 mg | ORAL_TABLET | Freq: Two times a day (BID) | ORAL | 0 refills | Status: AC

## 2017-05-09 MED ORDER — AMOXICILLIN-POT CLAVULANATE 875-125 MG PO TABS: 1.0000 | ORAL_TABLET | Freq: Two times a day (BID) | ORAL | 0 refills | Status: AC

## 2017-05-09 MED ORDER — METHOCARBAMOL 500 MG PO TABS: 500.0000 mg | ORAL_TABLET | Freq: Four times a day (QID) | ORAL | 0 refills | Status: DC | PRN

## 2017-05-09 MED ORDER — INSULIN GLARGINE 100 UNIT/ML SOLOSTAR PEN: 12.0000 [IU] | PEN_INJECTOR | Freq: Every day | SUBCUTANEOUS | 0 refills | Status: DC

## 2017-05-09 MED ORDER — METFORMIN HCL 500 MG PO TABS: 500.0000 mg | ORAL_TABLET | Freq: Two times a day (BID) | ORAL | 0 refills | Status: DC

## 2017-05-09 MED ORDER — SODIUM CHLORIDE 0.9 % IV SOLN
3.0000 g | Freq: Four times a day (QID) | INTRAVENOUS | Status: DC
  Administered 2017-05-09 (×2): 3 g via INTRAVENOUS
  Filled 2017-05-09 (×3): qty 3

## 2017-05-09 MED ORDER — DOXYCYCLINE HYCLATE 100 MG PO TABS
100.0000 mg | ORAL_TABLET | Freq: Two times a day (BID) | ORAL | Status: DC
  Administered 2017-05-09: 100 mg via ORAL
  Filled 2017-05-09: qty 1

## 2017-05-09 MED ORDER — BLOOD GLUCOSE MONITOR KIT: PACK | 0 refills | Status: DC

## 2017-05-09 NOTE — Progress Notes (Signed)
Completed diabetic education with patient, going over meal and insulin regimen.  At discharge I noticed the patient's foot was very red towards the dorsum, more than earlier in the day.  I notified Dr. Otelia SergeantNitka, who was on call for Dr. Lajoyce Cornersuda, and Dr. Otelia SergeantNitka said he felt redness was ok as long as patient was afebrile and sugars were not trending up.  Dr. Otelia SergeantNitka said the patient was ok for discharge.

## 2017-05-09 NOTE — Progress Notes (Signed)
Patient ID: Russell Merritt, male   DOB: Apr 04, 1966, 51 y.o.   MRN: [id number] Patient is postoperative day 1 left foot first ray amputation. The wound VAC is in place there is no drainage. Patient has no complaints. Anticipate patient can discharge to home once he is safe with ambulation nonweightbearing on the left. Of note he does have a little bit of redness on the dorsum of his foot. I am not concerned for abscess there was no signs of any deep abscess. Recommend discharge to home on either doxycycline or Keflex for coverage. Patient will discharge with the Prevena wound VAC in follow-up in the office next week to remove the wound VAC.

## 2017-05-09 NOTE — Progress Notes (Signed)
Pharmacy Antibiotic Note  Russell Merritt is a 51 y.o. male admitted on 05/05/2017 with osteomyelitis of L-great toe s/p L-1st ray amputation on 6/8.  Pharmacy has been consulted for Unasyn dosing. Vancomycin and Flagyl discontinued. WBC up to 17 today (Surgery yesterday). SCr stable at 0.80. Cultures still pending.   Plan: Change to Unasyn 3g IV every 6 hours.  Monitor renal function, clinical status, and culture results   Height: 6' (182.9 cm) Weight: 270 lb (122.5 kg) IBW/kg (Calculated) : 77.6  Temp (24hrs), Avg:98.2 F (36.8 C), Min:97.8 F (36.6 C), Max:98.5 F (36.9 C)   Recent Labs Lab 05/05/17 2041 05/05/17 2305 05/06/17 0444 05/08/17 1000 05/09/17 0413  WBC 17.1*  --  16.9* 15.6* 17.0*  CREATININE 0.94  --  0.78 0.65 0.80  LATICACIDVEN  --  1.1 0.8  --   --   VANCOTROUGH  --   --   --  8*  --     Estimated Creatinine Clearance: 147.7 mL/min (by C-G formula based on SCr of 0.8 mg/dL).    Allergies  Allergen Reactions  . No Known Allergies     Antimicrobials this admission: 6/5 vancomycin>>6/9 6/5 Flagyl >> 6/9 6/9 Unasyn >>  Dose adjustments this admission:   Microbiology results: 6/5 wound Cx (superficial): mod GPC in pairs/chains, few GNR 6/5 BCx: ngtd 6/6 wound Cx (superficial): few GPC in pairs, rare GN coccobacilli  Thank you for allowing pharmacy to be a part of this patient's care.  Russell SnufferJessica Jesaiah Merritt, PharmD, BCPS Clinical Pharmacist Clinical phone 05/09/2017 until 3:30 PM [phone] After hours, please call #[phone] 05/09/2017 8:09 AM

## 2017-05-09 NOTE — Evaluation (Signed)
Physical Therapy Evaluation Patient Details Name: Russell NineKevin W Merritt MRN: [id number] DOB: 02/15/1966 Today's Date: 05/09/2017   History of Present Illness  Pt admittef for 1st ray amputation due to osteomylitis and non-healing amputation of the left great toe at the MTP joint.  PMH significant for recent dx of diabetes   Clinical Impression  Patient presents with dependencies in gait and mobility due to pain and balance deficits.  Patient will benefit from further PT to increase independence and mobility.  Patient would like to go home today if possible.  Will see this afternoon and attempt stair training. If successful, feel patient will be able to discharge from PT standpoint.    Follow Up Recommendations Home health PT    Equipment Recommendations  Rolling walker with 5" wheels;3in1 (PT)    Recommendations for Other Services       Precautions / Restrictions Precautions Precautions: Fall Required Braces or Orthoses: Other Brace/Splint Other Brace/Splint: post op shoe left Restrictions Weight Bearing Restrictions: Yes RLE Weight Bearing: Non weight bearing      Mobility  Bed Mobility Overal bed mobility: Modified Independent             General bed mobility comments: used railing to get to EOB  Transfers Overall transfer level: Needs assistance Equipment used: Rolling walker (2 wheeled) Transfers: Sit to/from Stand Sit to Stand: Min guard         General transfer comment: min guard for safety/balance  Ambulation/Gait Ambulation/Gait assistance: Min assist Ambulation Distance (Feet): 10 Feet Assistive device: Rolling walker (2 wheeled) Gait Pattern/deviations: Step-to pattern Gait velocity: decreased   General Gait Details: able to maintain NWB, decreased endurance for ambulation  Stairs            Wheelchair Mobility    Modified Rankin (Stroke Patients Only)       Balance Overall balance assessment: Needs assistance Sitting-balance support: No  upper extremity supported;Feet supported Sitting balance-Leahy Scale: Good     Standing balance support: Bilateral upper extremity supported Standing balance-Leahy Scale: Poor Standing balance comment: reliant on RW for balance                             Pertinent Vitals/Pain Pain Assessment: 0-10 Pain Score: 4  Pain Location: left foot during ambulation Pain Descriptors / Indicators: Throbbing;Aching;Guarding Pain Intervention(s): Limited activity within patient's tolerance;Monitored during session;Repositioned    Home Living Family/patient expects to be discharged to:: Private residence Living Arrangements: Alone Available Help at Discharge: Friend(s);Available PRN/intermittently Type of Home: House Home Access: Stairs to enter Entrance Stairs-Rails: Can reach both;Right;Left Entrance Stairs-Number of Steps: 3-4 Home Layout: One level Home Equipment: None      Prior Function Level of Independence: Independent               Hand Dominance        Extremity/Trunk Assessment   Upper Extremity Assessment Upper Extremity Assessment: Overall WFL for tasks assessed    Lower Extremity Assessment Lower Extremity Assessment: Overall WFL for tasks assessed    Cervical / Trunk Assessment Cervical / Trunk Assessment: Normal  Communication   Communication: No difficulties  Cognition Arousal/Alertness: Awake/alert Behavior During Therapy: WFL for tasks assessed/performed Overall Cognitive Status: Within Functional Limits for tasks assessed  General Comments      Exercises     Assessment/Plan    PT Assessment Patient needs continued PT services  PT Problem List Decreased activity tolerance;Decreased balance;Decreased mobility;Decreased knowledge of precautions;Pain       PT Treatment Interventions DME instruction;Gait training;Stair training;Functional mobility training;Therapeutic  activities;Balance training;Patient/family education    PT Goals (Current goals can be found in the Care Plan section)  Acute Rehab PT Goals Patient Stated Goal: go home PT Goal Formulation: With patient Time For Goal Achievement: 05/12/17 Potential to Achieve Goals: Good    Frequency Min 5X/week   Barriers to discharge Decreased caregiver support      Co-evaluation               AM-PAC PT "6 Clicks" Daily Activity  Outcome Measure Difficulty turning over in bed (including adjusting bedclothes, sheets and blankets)?: A Little Difficulty moving from lying on back to sitting on the side of the bed? : A Little Difficulty sitting down on and standing up from a chair with arms (e.g., wheelchair, bedside commode, etc,.)?: A Little Help needed moving to and from a bed to chair (including a wheelchair)?: A Little Help needed walking in hospital room?: A Little Help needed climbing 3-5 steps with a railing? : A Little 6 Click Score: 18    End of Session Equipment Utilized During Treatment: Gait belt Activity Tolerance: Patient tolerated treatment well Patient left: in chair;with call bell/phone within reach   PT Visit Diagnosis: Other abnormalities of gait and mobility (R26.89)    Time: 6045-4098 PT Time Calculation (min) (ACUTE ONLY): 26 min   Charges:   PT Evaluation $PT Eval Moderate Complexity: 1 Procedure PT Treatments $Gait Training: 8-22 mins   PT G Codes:        May 13, 2017 [facility], PT [phone]    Olivia Canter May 13, 2017, 12:30 PM

## 2017-05-09 NOTE — Discharge Instructions (Signed)
Blood Glucose Monitoring, Adult Monitoring your blood sugar (glucose) helps you manage your diabetes. It also helps you and your health care provider determine how well your diabetes management plan is working. Blood glucose monitoring involves checking your blood glucose as often as directed, and keeping a record (log) of your results over time. Why should I monitor my blood glucose? Checking your blood glucose regularly can:  Help you understand how food, exercise, illnesses, and medicines affect your blood glucose.  Let you know what your blood glucose is at any time. You can quickly tell if you are having low blood glucose (hypoglycemia) or high blood glucose (hyperglycemia).  Help you and your health care provider adjust your medicines as needed.  When should I check my blood glucose? Follow instructions from your health care provider about how often to check your blood glucose. This may depend on:  The type of diabetes you have.  How well-controlled your diabetes is.  Medicines you are taking.  If you have type 1 diabetes:  Check your blood glucose at least 2 times a day.  Also check your blood glucose: ? Before every insulin injection. ? Before and after exercise. ? Between meals. ? 2 hours after a meal. ? Occasionally between 2:00 a.m. and 3:00 a.m., as directed. ? Before potentially dangerous tasks, like driving or using heavy machinery. ? At bedtime.  You may need to check your blood glucose more often, up to 6-10 times a day: ? If you use an insulin pump. ? If you need multiple daily injections (MDI). ? If your diabetes is not well-controlled. ? If you are ill. ? If you have a history of severe hypoglycemia. ? If you have a history of not knowing when your blood glucose is getting low (hypoglycemia unawareness). If you have type 2 diabetes:  If you take insulin or other diabetes medicines, check your blood glucose at least 2 times a day.  If you are on intensive  insulin therapy, check your blood glucose at least 4 times a day. Occasionally, you may also need to check between 2:00 a.m. and 3:00 a.m., as directed.  Also check your blood glucose: ? Before and after exercise. ? Before potentially dangerous tasks, like driving or using heavy machinery.  You may need to check your blood glucose more often if: ? Your medicine is being adjusted. ? Your diabetes is not well-controlled. ? You are ill. What is a blood glucose log?  A blood glucose log is a record of your blood glucose readings. It helps you and your health care provider: ? Look for patterns in your blood glucose over time. ? Adjust your diabetes management plan as needed.  Every time you check your blood glucose, write down your result and notes about things that may be affecting your blood glucose, such as your diet and exercise for the day.  Most glucose meters store a record of glucose readings in the meter. Some meters allow you to download your records to a computer. How do I check my blood glucose? Follow these steps to get accurate readings of your blood glucose: Supplies needed   Blood glucose meter.  Test strips for your meter. Each meter has its own strips. You must use the strips that come with your meter.  A needle to prick your finger (lancet). Do not use lancets more than once.  A device that holds the lancet (lancing device).  A journal or log book to write down your results. Procedure  meter has its own strips. You must use the strips that come with your meter.   A needle to prick your finger (lancet). Do not use lancets more than once.   A device that holds the lancet (lancing device).   A journal or log book to write down your results.  Procedure   Wash your hands with soap and water.   Prick the side of your finger (not the tip) with the lancet. Use a different finger each time.   Gently rub the finger until a small drop of blood appears.   Follow instructions that come with your meter for inserting the test strip, applying blood to the strip, and using your blood glucose meter.   Write down your result and any notes.  Alternative testing sites   Some meters allow you to use areas of your body other than your finger  (alternative sites) to test your blood.   If you think you may have hypoglycemia, or if you have hypoglycemia unawareness, do not use alternative sites. Use your finger instead.   Alternative sites may not be as accurate as the fingers, because blood flow is slower in these areas. This means that the result you get may be delayed, and it may be different from the result that you would get from your finger.   The most common alternative sites are:  ? Forearm.  ? Thigh.  ? Palm of the hand.  Additional tips   Always keep your supplies with you.   If you have questions or need help, all blood glucose meters have a 24-hour "hotline" number that you can call. You may also contact your health care provider.   After you use a few boxes of test strips, adjust (calibrate) your blood glucose meter by following instructions that came with your meter.  This information is not intended to replace advice given to you by your health care provider. Make sure you discuss any questions you have with your health care provider.  Document Released: 11/20/2003 Document Revised: 06/06/2016 Document Reviewed: 04/28/2016  Elsevier Interactive Patient Education  2017 [employer]    Correction Insulin  Correction insulin, also called corrective insulin or a supplemental dose, is a small amount of insulin that can be used to lower your blood sugar (glucose) if it is too high. You may be instructed to check your blood glucose at certain times of the day and to use correction insulin as needed to lower your blood glucose to your target range.  Correction insulin is primarily used as part of diabetes management. It may also be prescribed for people who do not have diabetes.  What is a correction scale?  A correction scale, also called a sliding scale, is prescribed by your health care provider to help you determine when you need correction insulin. Your correction scale is based on your individual treatment goals, and it has two  parts:   Ranges of blood glucose levels.   How much correction insulin to give yourself if your blood sugar falls within a certain range.    If your blood glucose is in your desired range, you will not need correction insulin and you should take your normal insulin dose.  What type of insulin do I need?  Your health care provider may prescribe rapid-acting or short-acting insulin for you to use as correction insulin.  Rapid-acting insulin:   Starts working quickly, in as little as 5 minutes.   Can last for 3-6 hours.     Works well when taken right before a meal to quickly lower blood glucose.  Short-acting insulin:   Starts working in about 30 minutes.   Can last for 6-8 hours.   Should be taken about 30 minutes before you start eating a meal.  Talk with your health care provider or pharmacist about which type of correction insulin to take and when to take it. If you use insulin to control your diabetes, you should use correction insulin in addition to the longer-acting (basal) insulin that you normally use.  How do I manage my blood glucose with correction insulin?  Giving a correction dose   Check your blood glucose as directed by your health care provider.   Use your correction scale to find the range that your blood glucose is in.   Identify the units of insulin that match your blood glucose range.   Make sure you have food available that you can eat in the next 15-30 minutes, after your correction dose.   Give yourself the dose of correction insulin that your health care provider has prescribed in your correction scale. Always make sure you are using the right type of insulin.  ? If your correction insulin is rapid-acting, start eating a meal within 15 minutes after your correction dose to keep your blood glucose from getting too low.  ? If your correction insulin is short-acting, start eating a meal within 30 minutes after your correction dose to keep your blood glucose from getting too  low.  Keeping a blood glucose log   Write down your blood glucose test results and the amount of insulin that you give yourself. Do this every time you check blood glucose or take insulin. Bring this log with you to your medical visits. This information will help your health care provider to manage your medicines.   Note anything that may affect your blood glucose, such as:  ? Changes in normal exercise or activity.  ? Changes in your normal schedule, such as changes in your sleep routine, going on vacation, changing your diet, or holidays.  ? New over-the-counter or prescription medicines.  ? Illness, stress, or anxiety.  ? Changes in the time that you took your medicine or insulin.  ? Changes in your meals, such as skipping a meal, having a late meal, or dining out.  ? Eating things that may affect blood glucose, such as snacks, meal portions that are larger than normal, drinks that contain sugar, or eating less than usual.  What do I need to know about hyperglycemia and hypoglycemia?  What is hyperglycemia?  Hyperglycemia, also called high blood glucose, occurs when blood glucose is too high. Make sure you know the early signs of hyperglycemia, such as:   Increased thirst.   Hunger.   Feeling very tired.   Needing to urinate more often than usual.   Blurry vision.    What is hypoglycemia?  Hypoglycemia is also called low blood glucose. Be aware of "stacking" your insulin doses. This happens when you correct a high blood glucose by giving yourself extra insulin too soon after a previous correction dose or mealtime dose. This may cause you to have too much insulin in your body and may put you at risk for hypoglycemia. Hypoglycemia occurs with a blood glucose level at or below 70 mg/dL (3.9 mmol/L).  It is important to know the symptoms of hypoglycemia and treat it right away. Always have a 15-gram rapid-acting carbohydrate snack with you to treat low blood   glucose. Family members and close friends should  also know the symptoms and should understand how to treat hypoglycemia, in case you are not able to treat yourself.  What are the symptoms of hypoglycemia?  Hypoglycemia symptoms can include:   Hunger.   Anxiety.   Sweating and feeling clammy.   Confusion.   Dizziness or light-headedness.   Sleepiness.   Nausea.   Increased heart rate.   Headache.   Blurry vision.   Jerky movements that you cannot control (seizure).   Nightmares.   Tingling or numbness around the mouth, lips, or tongue.   A change in speech.   Decreased ability to concentrate.   A change in coordination.   Restless sleep.   Tremors or shakes.   Fainting.   Irritability.    How do I treat hypoglycemia?  If you are alert and able to swallow safely, follow the 15:15 rule:   Take 15 grams of a rapid-acting carbohydrate. Rapid-acting options include:  ? 1 tube of glucose gel.  ? 3 glucose pills.  ? 6-8 pieces of hard candy.  ? 4 oz (120 mL) of fruit juice.  ? 4 oz (120 mL) of regular (not diet) soda.   Check your blood glucose 15 minutes after you take the carbohydrate.  ? If the repeat blood glucose level is still at or below 70 mg/dL (3.9 mmol/L), take 15 grams of a carbohydrate again.  ? If your blood glucose level does not increase above 70 mg/dL (3.9 mmol/L) after 3 tries, seek emergency medical care.   After your blood glucose level returns to normal, eat a meal or a snack within 1 hour.    How do I treat severe hypoglycemia?  Severe hypoglycemia is when your blood glucose level is at or below 54 mg/dL (3 mmol/L). Severe hypoglycemia is an emergency. Do not wait to see if the symptoms will go away. Get medical help right away. Call your local emergency services (911 in the [place] Do not drive yourself to the hospital.  If you have severe hypoglycemia and you cannot eat or drink, you may need an injection of glucagon. A family member or close friend should learn how to check your blood glucose and how to give you a glucagon  injection. Ask your health care provider if you need to have an emergency glucagon injection kit available.  Severe hypoglycemia may need to be treated in a hospital. The treatment may include getting glucose through an IV tube. You may also need treatment for the cause of your hypoglycemia.  Why do I need correction insulin if I do not have diabetes?  If you do not have diabetes, your health care provider may prescribe insulin because:   Keeping your blood glucose in the target range is important for your overall health.   You are taking medicines that cause your blood glucose to be higher than normal.    Contact a health care provider if:   You develop low blood glucose that you are not able to treat yourself.   You have high blood glucose that you are not able to correct with correction insulin.   Your blood glucose is often too low.   You used emergency glucagon to treat low blood glucose.  Get help right away if:   You become unresponsive. If this happens, someone else should call emergency services (911 in the [place] right away.   Your blood glucose is lower than 54 mg/dL (3.0 mmol/L).   You   case you are not able to treat yourself. This information is not intended to replace advice given to you by your health care provider. Make sure you discuss any questions you have with your health care provider. Document Released: 04/10/2011 Document Revised: 08/15/2016 Document Reviewed: 08/15/2016 Elsevier Interactive Patient Education  2018 [employer].   Complementary and Alternative Medical Therapies for Diabetes Complementary and alternative medical therapies are treatments that are different from typical medical treatments (Western treatments). "Complementary" means that the therapy is used with Western treatments. "Alternative" means that the therapy is used instead of Western treatments. Are these therapies safe? Some of these therapies are usually safe. Others may be harmful. Often, there is not enough research to show how safe or effective a therapy is. If you want to try a complementary or alternative therapy, talk with your health care provider to make sure it is safe. What alternative or complementary therapies are used to treat diabetes? Acupuncture Acupuncture is the insertion of needles into certain places on the skin. This is done by a professional. It is often used to relieve long-term (chronic) pain, especially of the bones and joints. It may help you if you have painful nerve damage. Biofeedback Biofeedback helps you to become more aware of your body's response to pain. It also helps you to learn ways of dealing with pain. Biofeedback is about relaxing and reducing stress. An example of a biofeedback technique is guided imagery. This involves creating peaceful images in your mind. Chromium Chromium is a substance that can help improve how insulin works in the body.  Chromium is in many foods, including whole grains, nuts, and egg yolks. Chromium may also be taken as a supplement. Taking chromium supplements may help to control diabetes, especially if you have a lack of chromium (deficiency) in your body. However, research has not proven this. If you have kidney problems, you should be careful with chromium supplements. American ginseng American ginseng is an herb that may lower glucose levels. It may also help lower A1C levels. More research is needed before recommendations for ginseng use can be made. Magnesium Magnesium is a mineral found in many foods, such as whole grains, nuts, and green leafy vegetables. Having low magnesium levels may make controlling blood glucose more difficult for people who have type 2 diabetes. Low magnesium levels may also contribute to certain diabetes complications. Getting more magnesium and eating a high-fiber diet may reduce the risk of developing type 2 diabetes. Vanadium Vanadium is a compound found in small amounts in certain plants and animals. Some studies show that it improves glucose levels in animals with diabetes. In one study, people with diabetes were able to lower their insulin dosage when taking vanadium. More research about side effects and safe dosage levels is needed. Cinnamon Cinnamon may decrease insulin resistance, increase insulin production, and lower blood glucose levels. It may work best when used with diabetes medicines. Fenugreek Fenugreek is an herb whose seeds are often used in cooking. It may help lower blood glucose by decreasing carbohydrate absorption and increasing insulin production. Summary  Talk with your health care provider about complementary or alternative therapy for you. Some therapies may be appropriate for you, but others may cause side effects.  Follow your diabetes care plan as prescribed. This information is not intended to replace advice given to you by your health care provider.  Make sure you discuss any questions you have with your health care provider.  Carbohydrate Counting for Diabetes Mellitus, Adult Carbohydrate counting  is a method for keeping track of how many carbohydrates you eat. Eating carbohydrates naturally increases the amount of sugar (glucose) in the blood. Counting how many carbohydrates you eat helps keep your blood glucose within normal limits, which helps you manage your diabetes (diabetes mellitus). It is important to know how many carbohydrates you can safely have in each meal. This is different for every person. A diet and nutrition specialist (registered dietitian) can help you make a meal plan and calculate how many carbohydrates you should have at each meal and snack. Carbohydrates are found in the following foods:  Grains, such as breads and cereals.  Dried beans and soy products.  Starchy vegetables, such as potatoes, peas, and corn.  Fruit and fruit juices.  Milk and yogurt.  Sweets and snack foods, such as cake, cookies, candy, chips, and soft drinks.  How do I count carbohydrates? There are two ways to count carbohydrates in food. You can use either of the methods or a combination of both. Reading "Nutrition Facts" on packaged food The "Nutrition Facts" list is included on the labels of almost all packaged foods and beverages in the [place] It includes:  The serving size.  Information about nutrients in each serving, including the grams (g) of carbohydrate per serving.  To use the Nutrition Facts":  Decide how many servings you will have.  Multiply the number of servings by the number of carbohydrates per serving.  The resulting number is the total amount of carbohydrates that you will be having.  Learning standard serving sizes of other foods When you eat foods containing carbohydrates that are not packaged or do not include "Nutrition Facts" on the label, you need to measure the servings in order to count the amount of  carbohydrates:  Measure the foods that you will eat with a food scale or measuring cup, if needed.  Decide how many standard-size servings you will eat.  Multiply the number of servings by 15. Most carbohydrate-rich foods have about 15 g of carbohydrates per serving. ? For example, if you eat 8 oz (170 g) of strawberries, you will have eaten 2 servings and 30 g of carbohydrates (2 servings x 15 g = 30 g).  For foods that have more than one food mixed, such as soups and casseroles, you must count the carbohydrates in each food that is included.  The following list contains standard serving sizes of common carbohydrate-rich foods. Each of these servings has about 15 g of carbohydrates:   hamburger bun or  English muffin.   oz (15 mL) syrup.   oz (14 g) jelly.  1 slice of bread.  1 six-inch tortilla.  3 oz (85 g) cooked rice or pasta.  4 oz (113 g) cooked dried beans.  4 oz (113 g) starchy vegetable, such as peas, corn, or potatoes.  4 oz (113 g) hot cereal.  4 oz (113 g) mashed potatoes or  of a large baked potato.  4 oz (113 g) canned or frozen fruit.  4 oz (120 mL) fruit juice.  4-6 crackers.  6 chicken nuggets.  6 oz (170 g) unsweetened dry cereal.  6 oz (170 g) plain fat-free yogurt or yogurt sweetened with artificial sweeteners.  8 oz (240 mL) milk.  8 oz (170 g) fresh fruit or one small piece of fruit.  24 oz (680 g) popped popcorn.  Example of carbohydrate counting Sample meal  3 oz (85 g) chicken breast.  6 oz (170 g) brown rice.  4 oz (113 g) corn.  8 oz (240 mL) milk.  8 oz (170 g) strawberries with sugar-free whipped topping. Carbohydrate calculation 1. Identify the foods that contain carbohydrates: ? Rice. ? Corn. ? Milk. ? Strawberries. 2. Calculate how many servings you have of each food: ? 2 servings rice. ? 1 serving corn. ? 1 serving milk. ? 1 serving strawberries. 3. Multiply each number of servings by 15 g: ? 2 servings  rice x 15 g = 30 g. ? 1 serving corn x 15 g = 15 g. ? 1 serving milk x 15 g = 15 g. ? 1 serving strawberries x 15 g = 15 g. 4. Add together all of the amounts to find the total grams of carbohydrates eaten: ? 30 g + 15 g + 15 g + 15 g = 75 g of carbohydrates total. This information is not intended to replace advice given to you by your health care provider. Make sure you discuss any questions you have with your health care provider. Document Released: 11/17/2005 Document Revised: 06/06/2016 Document Reviewed: 04/30/2016 Elsevier Interactive Patient Education  2018 [employer]. Diabetes Mellitus and Food It is important for you to manage your blood sugar (glucose) level. Your blood glucose level can be greatly affected by what you eat. Eating healthier foods in the appropriate amounts throughout the day at about the same time each day will help you control your blood glucose level. It can also help slow or prevent worsening of your diabetes mellitus. Healthy eating may even help you improve the level of your blood pressure and reach or maintain a healthy weight. General recommendations for healthful eating and cooking habits include:  Eating meals and snacks regularly. Avoid going long periods of time without eating to lose weight.  Eating a diet that consists mainly of plant-based foods, such as fruits, vegetables, nuts, legumes, and whole grains.  Using low-heat cooking methods, such as baking, instead of high-heat cooking methods, such as deep frying.  Work with your dietitian to make sure you understand how to use the Nutrition Facts information on food labels. How can food affect me? Carbohydrates Carbohydrates affect your blood glucose level more than any other type of food. Your dietitian will help you determine how many carbohydrates to eat at each meal and teach you how to count carbohydrates. Counting carbohydrates is important to keep your blood glucose at a healthy level,  especially if you are using insulin or taking certain medicines for diabetes mellitus. Alcohol Alcohol can cause sudden decreases in blood glucose (hypoglycemia), especially if you use insulin or take certain medicines for diabetes mellitus. Hypoglycemia can be a life-threatening condition. Symptoms of hypoglycemia (sleepiness, dizziness, and disorientation) are similar to symptoms of having too much alcohol. If your health care provider has given you approval to drink alcohol, do so in moderation and use the following guidelines:  Women should not have more than one drink per day, and men should not have more than two drinks per day. One drink is equal to: ? 12 oz of beer. ? 5 oz of wine. ? 1 oz of hard liquor.  Do not drink on an empty stomach.  Keep yourself hydrated. Have water, diet soda, or unsweetened iced tea.  Regular soda, juice, and other mixers might contain a lot of carbohydrates and should be counted.  What foods are not recommended? As you make food choices, it is important to remember that all foods are not the same. Some foods have fewer nutrients  per serving than other foods, even though they might have the same number of calories or carbohydrates. It is difficult to get your body what it needs when you eat foods with fewer nutrients. Examples of foods that you should avoid that are high in calories and carbohydrates but low in nutrients include:  Trans fats (most processed foods list trans fats on the Nutrition Facts label).  Regular soda.  Juice.  Candy.  Sweets, such as cake, pie, doughnuts, and cookies.  Fried foods.  What foods can I eat? Eat nutrient-rich foods, which will nourish your body and keep you healthy. The food you should eat also will depend on several factors, including:  The calories you need.  The medicines you take.  Your weight.  Your blood glucose level.  Your blood pressure level.  Your cholesterol level.  You should eat a  variety of foods, including:  Protein. ? Lean cuts of meat. ? Proteins low in saturated fats, such as fish, egg whites, and beans. Avoid processed meats.  Fruits and vegetables. ? Fruits and vegetables that may help control blood glucose levels, such as apples, mangoes, and yams.  Dairy products. ? Choose fat-free or low-fat dairy products, such as milk, yogurt, and cheese.  Grains, bread, pasta, and rice. ? Choose whole grain products, such as multigrain bread, whole oats, and brown rice. These foods may help control blood pressure.  Fats. ? Foods containing healthful fats, such as nuts, avocado, olive oil, canola oil, and fish.  Does everyone with diabetes mellitus have the same meal plan? Because every person with diabetes mellitus is different, there is not one meal plan that works for everyone. It is very important that you meet with a dietitian who will help you create a meal plan that is just right for you. This information is not intended to replace advice given to you by your health care provider. Make sure you discuss any questions you have with your health care provider. Document Released: 08/14/2005 Document Revised: 04/24/2016 Document Reviewed: 10/14/2013 Elsevier Interactive Patient Education  2017 [place] Released: 09/14/2007 Document Revised: 12/03/2016 Document Reviewed: 12/03/2016 Elsevier Interactive Patient Education  2017 [employer].

## 2017-05-09 NOTE — Progress Notes (Signed)
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  PMH significant for recent dx of diabetes     PT Comments    Patient more mobile this afternoon and did better with RW and mobility.  Pt with low endurance for mobility and discussed with patient to build up endurance for mobility.  Ideally, would like patient to have assistance at home.  He reports neighbors will help.  Feel patient safe for discharge from PT standpoint with assistance for mobility.    Follow Up Recommendations  Home health PT     Equipment Recommendations  Rolling walker with 5" wheels;3in1 (PT)    Recommendations for Other Services       Precautions / Restrictions Precautions Precautions: Fall Required Braces or Orthoses: Other Brace/Splint Other Brace/Splint: post op shoe left Restrictions RLE Weight Bearing: Non weight bearing    Mobility  Bed Mobility Overal bed mobility: Modified Independent             General bed mobility comments: up in recliner upon arrival  Transfers Overall transfer level: Needs assistance Equipment used: Rolling walker (2 wheeled) Transfers: Sit to/from Stand Sit to Stand: Supervision         General transfer comment: supervisor for safety  Ambulation/Gait Ambulation/Gait assistance: Supervision Ambulation Distance (Feet): 15 Feet ( x 2) Assistive device: Rolling walker (2 wheeled) Gait Pattern/deviations: Step-to pattern Gait velocity: decreased   General Gait Details: able to maintain NWB, decreased endurance for ambulation   Stairs Stairs: Yes   Stair Management: Two rails;Forwards Number of Stairs: 3 General stair comments: supervision for safety, verbal cues for technique  Wheelchair Mobility    Modified Rankin (Stroke  Patients Only)       Balance Overall balance assessment: Needs assistance Sitting-balance support: No upper extremity supported;Feet supported Sitting balance-Leahy Scale: Good     Standing balance support: Bilateral upper extremity supported Standing balance-Leahy Scale: Poor Standing balance comment: reliant on RW for balance                            Cognition Arousal/Alertness: Awake/alert Behavior During Therapy: WFL for tasks assessed/performed Overall Cognitive Status: Within Functional Limits for tasks assessed                                        Exercises      General Comments        Pertinent Vitals/Pain Pain Assessment: 0-10 Pain Score: 4  Pain Location: left foot during ambulation Pain Descriptors / Indicators: Throbbing;Aching;Guarding Pain Intervention(s): Limited activity within patient's tolerance;Monitored during session;Repositioned    Home Living Family/patient expects to be discharged to:: Private residence Living Arrangements: Alone Available Help at Discharge: Friend(s);Available PRN/intermittently Type of Home: House Home Access: Stairs to enter Entrance Stairs-Rails: Can reach both;Right;Left Home Layout: One level Home Equipment: None      Prior Function Level of Independence: Independent          PT Goals (current goals can now be found in the care plan section) Acute Rehab PT Goals Patient Stated Goal: go home PT Goal Formulation: With patient Time For Goal Achievement: 05/12/17 Potential to Achieve Goals: Good Progress  towards PT goals: Progressing toward goals    Frequency    Min 5X/week      PT Plan Current plan remains appropriate    Co-evaluation              AM-PAC PT "6 Clicks" Daily Activity  Outcome Measure  Difficulty turning over in bed (including adjusting bedclothes, sheets and blankets)?: A Little Difficulty moving from lying on back to sitting on the side of the  bed? : A Little Difficulty sitting down on and standing up from a chair with arms (e.g., wheelchair, bedside commode, etc,.)?: A Little Help needed moving to and from a bed to chair (including a wheelchair)?: A Little Help needed walking in hospital room?: A Little Help needed climbing 3-5 steps with a railing? : A Little 6 Click Score: 18    End of Session Equipment Utilized During Treatment: Gait belt Activity Tolerance: Patient tolerated treatment well Patient left: in chair;with call bell/phone within reach Nurse Communication: Mobility status PT Visit Diagnosis: Other abnormalities of gait and mobility (R26.89)     Time: 1610-9604 PT Time Calculation (min) (ACUTE ONLY): 38 min  Charges:  $Gait Training: 38-52 mins                    G Codes:       Jun 04, 2017 Corlis Hove, PT [phone]     Olivia Canter 2017/06/04, 2:44 PM

## 2017-05-10 LAB — AEROBIC CULTURE W GRAM STAIN (SUPERFICIAL SPECIMEN)

## 2017-05-10 LAB — ANAEROBIC CULTURE

## 2017-05-10 LAB — AEROBIC CULTURE  (SUPERFICIAL SPECIMEN)

## 2017-05-11 ENCOUNTER — Ambulatory Visit: Payer: 59 | Admitting: Podiatry

## 2017-05-11 ENCOUNTER — Encounter: Payer: Self-pay | Admitting: Sports Medicine

## 2017-05-11 ENCOUNTER — Ambulatory Visit (INDEPENDENT_AMBULATORY_CARE_PROVIDER_SITE_OTHER): Payer: 59 | Admitting: Sports Medicine

## 2017-05-11 DIAGNOSIS — E11621 Type 2 diabetes mellitus with foot ulcer: Secondary | ICD-10-CM | POA: Diagnosis not present

## 2017-05-11 DIAGNOSIS — L97509 Non-pressure chronic ulcer of other part of unspecified foot with unspecified severity: Secondary | ICD-10-CM | POA: Diagnosis not present

## 2017-05-11 LAB — CULTURE, BLOOD (ROUTINE X 2)
CULTURE: NO GROWTH
CULTURE: NO GROWTH
SPECIAL REQUESTS: ADEQUATE
Special Requests: ADEQUATE

## 2017-05-11 MED ORDER — GLIPIZIDE 10 MG PO TABS: 10.0000 mg | ORAL_TABLET | Freq: Every day | ORAL | 11 refills | Status: DC

## 2017-05-11 MED ORDER — EMPAGLIFLOZIN-METFORMIN HCL ER 25-1000 MG PO TB24: 1.0000 | ORAL_TABLET | Freq: Every day | ORAL | 11 refills | Status: DC

## 2017-05-11 NOTE — Progress Notes (Signed)
  Subjective:    CC: DM type II, establish care  HPI: Mr. Nolen MuMcKinney is a 51 y.o. Male with new diagnosis of type II diabetes s/p left 1st ray amputation after presenting to urgent care with osteomyelitis of the left first toe last week who presents for follow-up and establishment of care. Pt was confused about how to use glucometer, insulin he received on discharge. He also was concerned about redness in his left foot. He was told he would be called to schedule a follow-up appointment with the orthopedic surgeon, but he never received the call to schedule it so he scheduled an appointment here.  Past medical history:  Negative.  See flowsheet/record as well for more information.  Surgical history: Negative.  See flowsheet/record as well for more information.  Family history: Negative.  See flowsheet/record as well for more information.  Social history: Negative.  See flowsheet/record as well for more information.  Allergies, and medications have been entered into the medical record, reviewed, and no changes needed.   Review of Systems: No fevers, chills, night sweats, weight loss, chest pain, or shortness of breath.   Objective:    General: Well Developed, well nourished, and in no acute distress.  Neuro: Alert and oriented x3, extra-ocular muscles intact, sensation grossly intact.  HEENT: Normocephalic, atraumatic, pupils equal round reactive to light, neck supple, no masses, no lymphadenopathy, thyroid nonpalpable.  Skin: Warm and dry, no rashes. Respiratory: Not using accessory muscles, speaking in full sentences, trachea midline. Cardiovascular: RRR, no m/r/g. MSK: Left foot is erythematous, tender with too much movement.   Impression and Recommendations:    Mr. Nolen MuMcKinney is a 51 y.o. Male with new diagnosis of type II DM who presents s/p left 1st ray amputation (05/08/16) to establish care and discuss appropriate diabetes management.  Patient was advised to follow-up with orthopedic  surgeon to discuss foot erythema. He will continue his augmentin and doxycycline as before until then.  Discontinue insulin for now. Starting Synjardy, glipizide max dose. Pt was taught how to use his glucometer, and instructed to check morning blood sugars every day. Follow-up in one month.

## 2017-05-11 NOTE — Assessment & Plan Note (Signed)
Hold off on insulin for now. Starting Synjardy, glipizide max dose. He will check morning blood sugars every day, we showed him how to use his meter. Return to see us in one month to see how things are going.

## 2017-05-15 ENCOUNTER — Encounter (INDEPENDENT_AMBULATORY_CARE_PROVIDER_SITE_OTHER): Payer: Self-pay | Admitting: Family

## 2017-05-15 ENCOUNTER — Ambulatory Visit (INDEPENDENT_AMBULATORY_CARE_PROVIDER_SITE_OTHER): Payer: Self-pay | Admitting: Family

## 2017-05-15 VITALS — Ht 72.0 in | Wt 270.0 lb

## 2017-05-15 DIAGNOSIS — M86272 Subacute osteomyelitis, left ankle and foot: Secondary | ICD-10-CM

## 2017-05-15 DIAGNOSIS — L97529 Non-pressure chronic ulcer of other part of left foot with unspecified severity: Secondary | ICD-10-CM

## 2017-05-15 DIAGNOSIS — Z89432 Acquired absence of left foot: Secondary | ICD-10-CM

## 2017-05-15 DIAGNOSIS — E11621 Type 2 diabetes mellitus with foot ulcer: Secondary | ICD-10-CM

## 2017-05-15 NOTE — Progress Notes (Signed)
   Post-Op Visit Note   Patient: Russell Merritt           Date of Birth: 1966/01/20           MRN: [id number] Visit Date: 05/15/2017 PCP: Patient, No Pcp Per  Chief Complaint:  Chief Complaint  Patient presents with  . Left Foot - Routine Post Op    05/08/17 left foot first ray amputation 1 week po    HPI:  The patient is a 51 year old gentleman who presents today one week status post left first ray amputation. Did have a VAC applied. Has considerable swelling. He has no concerns today. He's been nonweightbearing in wheelchair. States has been elevating his foot breakdown from of him even with his hips.    Ortho Exam The incision is well approximated with sutures there is massive swelling to the foot there is no erythema some bloody drainage there is no odor no cellulitis no sign of infection. No gaping.  Visit Diagnoses:  1. Partial nontraumatic amputation of foot, left (HCC)   2. 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 Subacute osteomyelitis of left foot (HCC)     Plan: Follow-up in office in 2 weeks for suture removal. Advised nonweightbearing until that time. Daily Dial soap cleansing and dry dressings. Elevate to The level of your heart for swelling  Follow-Up Instructions: Return in about 2 weeks (around 05/29/2017).   Imaging: No results found.  Orders:  No orders of the defined types were placed in this encounter.  No orders of the defined types were placed in this encounter.    PMFS History: Patient Active Problem List   Diagnosis Date Noted  . Cellulitis in diabetic foot (HCC) 05/05/2017  . Osteomyelitis (HCC) 05/05/2017  . Newly diagnosed diabetes (HCC) 05/05/2017  . Hyponatremia 05/05/2017  . Diabetic foot ulcer (HCC) 05/04/2017   No past medical history on file.  Family History  Problem Relation Age of Onset  . Family history unknown: Yes    Past Surgical History:  Procedure  Laterality Date  . AMPUTATION Left 05/06/2017   Procedure: AMPUTATION GREAT TOE;  Surgeon: Beverely LowNorris, Steve, MD;  Location: [facility];  Service: Orthopedics;  Laterality: Left;  . AMPUTATION Left 05/08/2017   Procedure: Left 1st Ray Amputation;  Surgeon: Nadara Mustarduda, Marcus V, MD;  [facility];  Service: Orthopedics;  Laterality: Left;  . HAND SURGERY Left   . TONSILLECTOMY     Social History   Occupational History  . Not on file.   Social History Main Topics  . Smoking status: Former Games developermoker  . Smokeless tobacco: Never Used  . Alcohol use No  . Drug use: No  . Sexual activity: Not on file

## 2017-05-17 NOTE — Discharge Summary (Signed)
[facility] Discharge Summary   Patient: Russell Merritt WVR:[id number]   PCP: Patient, No Pcp Per DOB: [birth year]   Date of admission: 05/05/2017   Date of discharge: 05/09/2017     Discharge Diagnoses:  Principal Problem:   Osteomyelitis (HCC) Active Problems:   Diabetic foot ulcer (HCC)   Cellulitis in diabetic foot (HCC)   Newly diagnosed diabetes (HCC)   Hyponatremia   Admitted From: home Disposition:  Home wih home health  Recommendations for Outpatient Follow-up:  1. Please follow up with PCP in 1-2 weeks as well as orthopedics   Follow-up Information    Russell Mustard, MD Follow up in 1 week(s).   Specialty:  Orthopedic Surgery Contact information: [address] [place] [address] [phone]        PCP. Schedule an appointment as soon as possible for a visit in 1 week(s).          Diet recommendation: carb modified diet  Activity: The patient is advised to gradually reintroduce usual activities.  Discharge Condition: good  Code Status: full code  History of present illness: As per the H and P dictated on admission, "Russell Merritt is a 51 y.o. male who denies any significant past medical history, while acknowledging that he has not seen a physician in many years, now presenting to the emergency department at the direction of his podiatrist for evaluation of pain, swelling, discoloration, and drainage from his left great toe, as well as fevers and nonspecific malaise. Patient reports that he had been in his usual state of health until 8 days ago when he noted some pain and redness involving the left first toe. He noted an ulcer at the plantar aspect of the toe at that time and reports cleaning it with peroxide and applying topical antibiotic ointment. Over the ensuing days, redness and pain increased and swelling developed. 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 He was started on Augmentin, but by that time he had already developed fevers and general malaise. His condition continued to worsen with persistent fevers and chills and lethargy. He was reevaluated by podiatry today, noted to have worsened, and directed to the ED. Admits this workup, the patient was also noted to have a random glucose greater than 400."  Hospital Course:  Pt. with PMH of diabetes undiagnosed.; admitted on 05/05/2017, presented with complaint of foot infection, was found to have osteomyelitis of the left toe. Summary of his active problems in the hospital is as following. 1. 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 Discharge on broad spectrum coverage with augmentin and doxycycline.   2. Diabetes mellitus, new diagnosis uncontrolled with hyperglycemia - Significantly elevated A1c, -continue sliding scale - insulin prescription provided, training provided, as well as education on diet.   3. Hyponatremia  - Resolved, pseudohyponatremia due to hyperglycemia  All other chronic medical condition were stable during the hospitalization.  Patient was seen by physical therapy, who recommended home health,  which was arranged by Education officer, museum and case Freight forwarder. On the day of the discharge the patient's vitals were stable, and no other acute medical condition were reported by patient. the patient was felt safe to be discharge at home with home health.  Procedures and Results:   Left first ray amputation with wound  VAC placement  Consultations:  Orthopedics  DISCHARGE MEDICATION: Discharge Medication List as of 05/09/2017  3:06 PM    START taking these medications   Details  acetaminophen (TYLENOL) 325 MG tablet Take 2 tablets (650 mg total) by mouth every 6 (six) hours as needed for mild pain (or Fever >/= 101)., Starting Sat 05/09/2017, No Print    blood glucose meter kit and supplies KIT Dispense based on patient and insurance preference. Use up to four times daily as directed. (FOR ICD-9 250.00, 250.01)., Print    docusate sodium (COLACE) 100 MG capsule Take 1 capsule (100 mg total) by mouth 2 (two) times daily as needed for mild constipation., Starting Sat 05/09/2017, Normal    doxycycline (VIBRA-TABS) 100 MG tablet Take 1 tablet (100 mg total) by mouth every 12 (twelve) hours., Starting Sat 05/09/2017, Until Thu 05/14/2017, Normal    HYDROcodone-acetaminophen (NORCO/VICODIN) 5-325 MG tablet Take 1-2 tablets by mouth every 6 (six) hours as needed for moderate pain., Starting Sat 05/09/2017, Print    Insulin Pen Needle 30G X 5 MM MISC 1 Act by Does not apply route 4 (four) times daily -  before meals and at bedtime., Starting Sat 05/09/2017, Normal    methocarbamol (ROBAXIN) 500 MG tablet Take 1 tablet (500 mg total) by mouth every 6 (six) hours as needed for muscle spasms., Starting Sat 05/09/2017, Normal    polyethylene glycol (MIRALAX / GLYCOLAX) packet Take 17 g by mouth daily as needed for mild constipation., Starting Sat 05/09/2017, Normal    saccharomyces boulardii (FLORASTOR) 250 MG capsule Take 1 capsule (250 mg total) by mouth 2 (two) times daily., Starting Sat 05/09/2017, Normal    insulin aspart (NOVOLOG) 100 UNIT/ML FlexPen BG < 70: implement hypoglycemia protocol  BG 70 - 120: 0 units  BG 121 - 150: 2 units  BG 151 - 200: 3 units  BG 201 - 250: 5 units  BG 251 - 300: 8 units  BG 301 - 350: 11 units  BG 351 - 400: 15 units  BG > 400 call MD, Print    Insulin Glargine (LANTUS) 100 UNIT/ML  Solostar Pen Inject 12 Units into the skin daily at 10 pm., Starting Sat 05/09/2017, Normal    metFORMIN (GLUCOPHAGE) 500 MG tablet Take 1 tablet (500 mg total) by mouth 2 (two) times daily with a meal., Starting Sat 05/09/2017, Until Sun 05/09/2018, Normal      CONTINUE these medications which have CHANGED   Details  amoxicillin-clavulanate (AUGMENTIN) 875-125 MG tablet Take 1 tablet by mouth 2 (two) times daily., Starting Sat 05/09/2017, Until Sun 05/17/2017, Normal      CONTINUE these medications which have NOT CHANGED   Details  ibuprofen (ADVIL,MOTRIN) 200 MG tablet Take 800 mg by mouth every 8 (eight) hours as needed for moderate pain. , Historical Med    MULTIPLE VITAMIN PO Take 1 tablet by mouth daily. , Historical Med      STOP taking these medications     clindamycin (CLEOCIN T) 1 % external solution        Allergies  Allergen Reactions  . No Known Allergies    Discharge Instructions    Diet -  low sodium heart healthy    Complete by:  As directed    Diet Carb Modified    Complete by:  As directed    Discharge instructions    Complete by:  As directed    It is important that you read following instructions as well as go over your medication list with RN to help you understand your care after this hospitalization.  Discharge Instructions: Please follow-up with PCP in one week  Please request your primary care physician to go over all Hospital Tests and Procedure/Radiological results at the follow up,  Please get all Hospital records sent to your PCP by signing hospital release before you go home.   Do not drive, operating heavy machinery, perform activities at heights, swimming or participation in water activities or provide baby sitting services while you are on Pain, Sleep and Anxiety Medications; until you have been seen by Primary Care Physician or a Neurologist and advised to do so again. Do not take more than prescribed Pain, Sleep and Anxiety Medications. You were  cared for by a hospitalist during your hospital stay. If you have any questions about your discharge medications or the care you received while you were in the hospital after you are discharged, you can call the unit and ask to speak with the hospitalist on call if the hospitalist that took care of you is not available.  Once you are discharged, your primary care physician will handle any further medical issues. Please note that NO REFILLS for any discharge medications will be authorized once you are discharged, as it is imperative that you return to your primary care physician (or establish a relationship with a primary care physician if you do not have one) for your aftercare needs so that they can reassess your need for medications and monitor your lab values. You Must read complete instructions/literature along with all the possible adverse reactions/side effects for all the Medicines you take and that have been prescribed to you. Take any new Medicines after you have completely understood and accept all the possible adverse reactions/side effects. Wear Seat belts while driving. If you have smoked or chewed Tobacco in the last 2 yrs please stop smoking and/or stop any Recreational drug use.   Increase activity slowly    Complete by:  As directed    Negative Pressure Wound Therapy - Incisional    Complete by:  As directed    Discharged with the Prevena wound VAC. This will be changed in the office next week.     Discharge Exam: Filed Weights   05/05/17 1711 05/08/17 1451  Weight: 122.5 kg (270 lb) 122.5 kg (270 lb)   Vitals:   05/09/17 0406 05/09/17 1446  BP: (!) 148/74 117/66  Pulse: 79 79  Resp:  17  Temp: 98.5 F (36.9 C) 99.2 F (37.3 C)   General: Appear in no distress, no Rash; Oral Mucosa moist. Cardiovascular: S1 and S2 Present, no Murmur, no JVD Respiratory: Bilateral Air entry present and Clear to Auscultation, no Crackles, no wheezes Abdomen: Bowel Sound present, Soft and  no tenderness Extremities: no Pedal edema, no calf tenderness, wound vac presen, mild redness of skin on foot Neurology: Grossly no focal neuro deficit.  The results of significant diagnostics from this hospitalization (including imaging, microbiology, ancillary and laboratory) are listed below for reference.    Significant Diagnostic Studies: Mr Toes Left Wo Contrast  Result Date: 05/04/2017 CLINICAL DATA:  Diabetic ulcer of the great toe. Pain. Swelling and  redness. EXAM: MRI OF THE LEFT TOES WITHOUT CONTRAST TECHNIQUE: Multiplanar, multisequence MR imaging of the left toes was performed. No intravenous contrast was administered. COMPARISON:  Radiographs dated 05/04/2017 FINDINGS: Bones/Joint/Cartilage There is osteomyelitis of the proximal and distal phalanges of the great toe with gas with in the bones. There is also gas in the adjacent soft tissues with dorsal and ventral to the bones. 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 Soft tissue ulceration noted on the plantar aspect of great toe. No discrete abscess Moderate arthritis of the first MTP joint. The bones of the other toes are normal. IMPRESSION: 1. Gas gangrene of the great toe. 2. Osteomyelitis of the proximal and distal phalanges of the great toe with gas within the bones. Electronically Signed   By: Lorriane Shire M.D.   On: 05/04/2017 17:17   Dg Foot Complete Left  Result Date: 05/04/2017 CLINICAL DATA:  Drainage, pain, and swelling associated with a defect in the skin along the ventral aspect of the first toe. EXAM: LEFT FOOT - COMPLETE 3+ VIEW COMPARISON:  None in PACs FINDINGS: There is diffuse swelling of the great toe. There is soft tissue gas both proximally and distally in the toe. None extends into the metatarsal region. No definite bony changes are seen suspicious for osteomyelitis. There is no acute fracture. There is mild degenerative change of the first metatarsophalangeal joint. The other phalanges appear normal. The  metatarsals are intact. There are mild degenerative changes of the intertarsal joints. There are plantar and Achilles region calcaneal spurs. There is calcification within the plantar fascia. IMPRESSION: Findings consistent with cellulitis of the great toe with soft tissue gas collections. No objective evidence of osteomyelitis. 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 Electronically Signed   By: David  Martinique M.D.   On: 05/04/2017 11:43    Microbiology: Recent Results (from the past 240 hour(s))  Surgical pcr screen     Status: Abnormal   Collection Time: 05/08/17  5:33 AM  Result Value Ref Range Status   MRSA, PCR NEGATIVE NEGATIVE Final   Staphylococcus aureus POSITIVE (A) NEGATIVE Final    Comment:        The Xpert SA Assay (FDA approved for NASAL specimens in patients over 37 years of age), is one component of a comprehensive surveillance program.  Test performance has been validated by [facility] for patients greater than or equal to 9 year old. It is not intended to diagnose infection nor to guide or monitor treatment.      Time spent: 35 minutes  Signed:  Berle Mull  [facility] 05/09/2017   , 4:12 PM

## 2017-05-29 ENCOUNTER — Ambulatory Visit (INDEPENDENT_AMBULATORY_CARE_PROVIDER_SITE_OTHER): Payer: Self-pay | Admitting: Orthopedic Surgery

## 2017-05-29 DIAGNOSIS — S98112A Complete traumatic amputation of left great toe, initial encounter: Secondary | ICD-10-CM

## 2017-05-29 DIAGNOSIS — Z89412 Acquired absence of left great toe: Secondary | ICD-10-CM

## 2017-05-29 NOTE — Progress Notes (Signed)
Office Visit Note   Patient: Russell Merritt           Date of Birth: [birth year]           MRN: [id number] Visit Date: 05/29/2017              Requested by: No referring provider defined for this encounter. PCP: Patient, No Pcp Per  No chief complaint on file.     HPI: Patient presents 1 week status post first ray amputation left foot. He is on oral antibiotics and topical antibiotic ointment.  Assessment & Plan: Visit Diagnoses:  1. Amputated great toe, left (HCC)     Plan: Recommended start Dial soap cleansing daily continue to use the oral and topical robotics. Patient is given a prescription to go to [facility] to obtain compression stockings he will wear these around-the-clock.  Follow-Up Instructions: Return in about 1 week (around 06/05/2017).   Ortho Exam  Patient is alert, oriented, no adenopathy, well-dressed, normal affect, normal respiratory effort. Examination the distal quarter of the incision has some mild ischemic changes the proximal aspect is healed quite well we will have him continue with the wound care as discussed above. There is no redness no cellulitis no odor there is small amount of clear serosanguineous drainage the ulcer has no depth. It does not probe to bone or tendon.  Imaging: No results found.  Labs: Lab Results  Component Value Date   HGBA1C 13.4 (H) 05/07/2017   HGBA1C 13.9 (H) 05/06/2017   ESRSEDRATE 118 (H) 05/04/2017   REPTSTATUS 05/10/2017 FINAL 05/06/2017   REPTSTATUS 05/09/2017 FINAL 05/06/2017   GRAMSTAIN  05/06/2017    RARE WBC PRESENT, PREDOMINANTLY PMN FEW GRAM POSITIVE COCCI IN PAIRS RARE GRAM NEGATIVE COCCOBACILLI    CULT  05/06/2017    ABUNDANT PREVOTELLA BIVIA BETA LACTAMASE NEGATIVE Performed at [facility], [address] [place], [place] [address]    CULT  05/06/2017    FEW GROUP B STREP(S.AGALACTIAE)ISOLATED TESTING AGAINST S. AGALACTIAE NOT ROUTINELY PERFORMED DUE TO PREDICTABILITY OF AMP/PEN/VAN  SUSCEPTIBILITY. Performed at [facility], [address] [place], [place] [address]    LABORGA STAPHYLOCOCCUS AUREUS 05/05/2017    Orders:  No orders of the defined types were placed in this encounter.  No orders of the defined types were placed in this encounter.    Procedures: No procedures performed  Clinical Data: No additional findings.  ROS:  All other systems negative, except as noted in the HPI. Review of Systems  Objective: Vital Signs: There were no vitals taken for this visit.  Specialty Comments:  No specialty comments available.  PMFS History: Patient Active Problem List   Diagnosis Date Noted  . Amputated great toe, left (HCC) 05/29/2017  . Cellulitis in diabetic foot (HCC) 05/05/2017  . Osteomyelitis (HCC) 05/05/2017  . Newly diagnosed diabetes (HCC) 05/05/2017  . Hyponatremia 05/05/2017  . Diabetic foot ulcer (HCC) 05/04/2017   No past medical history on file.  Family History  Problem Relation Age of Onset  . Family history unknown: Yes    Past Surgical History:  Procedure Laterality Date  . AMPUTATION Left 05/06/2017   Procedure: AMPUTATION GREAT TOE;  Surgeon: Beverely LowNorris, Steve, MD;  Location: [facility];  Service: Orthopedics;  Laterality: Left;  . AMPUTATION Left 05/08/2017   Procedure: Left 1st Ray Amputation;  Surgeon: Nadara Mustarduda, Marcus V, MD;  [facility];  Service: Orthopedics;  Laterality: Left;  . HAND SURGERY Left   . TONSILLECTOMY  Social History   Occupational History  . Not on file.   Social History Main Topics  . Smoking status: Former Games developer  . Smokeless tobacco: Never Used  . Alcohol use No  . Drug use: No  . Sexual activity: Not on file

## 2017-06-08 ENCOUNTER — Ambulatory Visit (INDEPENDENT_AMBULATORY_CARE_PROVIDER_SITE_OTHER): Payer: Self-pay | Admitting: Orthopedic Surgery

## 2017-06-08 ENCOUNTER — Encounter (INDEPENDENT_AMBULATORY_CARE_PROVIDER_SITE_OTHER): Payer: Self-pay | Admitting: Orthopedic Surgery

## 2017-06-08 DIAGNOSIS — S98112A Complete traumatic amputation of left great toe, initial encounter: Secondary | ICD-10-CM

## 2017-06-08 DIAGNOSIS — Z89412 Acquired absence of left great toe: Secondary | ICD-10-CM

## 2017-06-08 NOTE — Progress Notes (Signed)
Office Visit Note   Patient: Russell Merritt           Date of Birth: May 16, 1966           MRN: [id number] Visit Date: 06/08/2017              Requested by: No referring provider defined for this encounter. PCP: Monica Becton, MD  Chief Complaint  Patient presents with  . Left Foot - Follow-up      HPI: Patient presents status post first ray amputation left foot. States has not been wearing his compression garments the him hold off of his scabs. States he had to soak them in water to remove them. He has been applying Neosporin daily.  Complains of swelling to his left foot.  Assessment & Plan: Visit Diagnoses:  1. Amputated great toe, left (HCC)     Plan: Continue daily Dial soap cleansing. Continue to minimize weightbearing on the forefoot. Apply the medical compression stocking daily with direct skin contact. He'll follow-up in office in 2 more weeks.  Follow-Up Instructions: Return in about 2 weeks (around 06/22/2017).   Ortho Exam  Patient is alert, oriented, no adenopathy, well-dressed, normal affect, normal respiratory effort. Examination the distal quarter of the incision has some mild ischemic changes. the proximal aspect is healed quite well. There is no redness no cellulitis no odor there is small amount of clear serosanguineous drainage the ulcer is 1 cm in diameter and has no depth. It does not probe to bone or tendon.  Imaging: No results found.  Labs: Lab Results  Component Value Date   HGBA1C 13.4 (H) 05/07/2017   HGBA1C 13.9 (H) 05/06/2017   ESRSEDRATE 118 (H) 05/04/2017   REPTSTATUS 05/10/2017 FINAL 05/06/2017   REPTSTATUS 05/09/2017 FINAL 05/06/2017   GRAMSTAIN  05/06/2017    RARE WBC PRESENT, PREDOMINANTLY PMN FEW GRAM POSITIVE COCCI IN PAIRS RARE GRAM NEGATIVE COCCOBACILLI    CULT  05/06/2017    ABUNDANT PREVOTELLA BIVIA BETA LACTAMASE NEGATIVE Performed at [facility], [address], [place], [place] [address]    CULT   05/06/2017    FEW GROUP B STREP(S.AGALACTIAE)ISOLATED TESTING AGAINST S. AGALACTIAE NOT ROUTINELY PERFORMED DUE TO PREDICTABILITY OF AMP/PEN/VAN SUSCEPTIBILITY. Performed at [facility], [address] [place], [place] [address]    LABORGA STAPHYLOCOCCUS AUREUS 05/05/2017    Orders:  No orders of the defined types were placed in this encounter.  No orders of the defined types were placed in this encounter.    Procedures: No procedures performed  Clinical Data: No additional findings.  ROS:  All other systems negative, except as noted in the HPI. Review of Systems  Constitutional: Negative for chills and fever.  Cardiovascular: Positive for leg swelling.  Skin: Positive for wound.    Objective: Vital Signs: There were no vitals taken for this visit.  Specialty Comments:  No specialty comments available.  PMFS History: Patient Active Problem List   Diagnosis Date Noted  . Amputated great toe, left (HCC) 05/29/2017  . Cellulitis in diabetic foot (HCC) 05/05/2017  . Osteomyelitis (HCC) 05/05/2017  . Newly diagnosed diabetes (HCC) 05/05/2017  . Hyponatremia 05/05/2017  . Diabetic foot ulcer (HCC) 05/04/2017   History reviewed. No pertinent past medical history.  Family History  Problem Relation Age of Onset  . Family history unknown: Yes    Past Surgical History:  Procedure Laterality Date  . AMPUTATION Left 05/06/2017   Procedure: AMPUTATION GREAT TOE;  Surgeon: Ranell Patrick,  Brett CanalesSteve, MD;  Location: [facility];  Service: Orthopedics;  Laterality: Left;  . AMPUTATION Left 05/08/2017   Procedure: Left 1st Ray Amputation;  Surgeon: Nadara Mustarduda, Marcus V, MD;  [facility];  Service: Orthopedics;  Laterality: Left;  . HAND SURGERY Left   . TONSILLECTOMY     Social History   Occupational History  . Not on file.   Social History Main Topics  . Smoking status: Former Games developermoker  . Smokeless tobacco: Never Used  . Alcohol use No  . Drug use: No  . Sexual activity: Not on file

## 2017-06-22 ENCOUNTER — Encounter (INDEPENDENT_AMBULATORY_CARE_PROVIDER_SITE_OTHER): Payer: Self-pay | Admitting: Orthopedic Surgery

## 2017-06-22 ENCOUNTER — Ambulatory Visit (INDEPENDENT_AMBULATORY_CARE_PROVIDER_SITE_OTHER): Payer: Self-pay | Admitting: Orthopedic Surgery

## 2017-06-22 DIAGNOSIS — Z89412 Acquired absence of left great toe: Secondary | ICD-10-CM

## 2017-06-22 DIAGNOSIS — S98112A Complete traumatic amputation of left great toe, initial encounter: Secondary | ICD-10-CM

## 2017-06-22 NOTE — Progress Notes (Signed)
Office Visit Note   Patient: Russell Merritt           Date of Birth: 10/31/1966           MRN: [id number] Visit Date: 06/22/2017              Requested by: Monica Becton, MD [address] [place], [place] [address] PCP: Patient, No Pcp Per  Chief Complaint  Patient presents with  . Left Foot - Routine Post Op    05/08/17 Left 1st ray amputation 45 days post op      HPI: Patient is a 51 year old gentleman status post left foot first ray amputation is currently cleansing it with dialysis of the states he normally wears a compression stocking but is not wearing it today.  Assessment & Plan: Visit Diagnoses:  1. Amputated great toe, left (HCC)     Plan: Recommended continued Dial soap cleansing recommended continue protected weightbearing for 2 weeks to allow this to completely heal. Recommended wearing the compression stocking around-the-clock.  Follow-Up Instructions: Return in about 2 weeks (around 07/06/2017).   Ortho Exam  Patient is alert, oriented, no adenopathy, well-dressed, normal affect, normal respiratory effort. Examination patient does have a scab around the incision. He has a good dorsalis pedis pulse he does have swelling in the left lower extremity the wound has no drainage no cellulitis no signs of infection. The wound measures 5 x 15 mm and is 1 mm deep.  Imaging: No results found.  Labs: Lab Results  Component Value Date   HGBA1C 13.4 (H) 05/07/2017   HGBA1C 13.9 (H) 05/06/2017   ESRSEDRATE 118 (H) 05/04/2017   REPTSTATUS 05/10/2017 FINAL 05/06/2017   REPTSTATUS 05/09/2017 FINAL 05/06/2017   GRAMSTAIN  05/06/2017    RARE WBC PRESENT, PREDOMINANTLY PMN FEW GRAM POSITIVE COCCI IN PAIRS RARE GRAM NEGATIVE COCCOBACILLI    CULT  05/06/2017    ABUNDANT PREVOTELLA BIVIA BETA LACTAMASE NEGATIVE Performed at [facility], [address], [place], [place] [address]    CULT  05/06/2017    FEW GROUP B  STREP(S.AGALACTIAE)ISOLATED TESTING AGAINST S. AGALACTIAE NOT ROUTINELY PERFORMED DUE TO PREDICTABILITY OF AMP/PEN/VAN SUSCEPTIBILITY. Performed at [facility], [address], [place], [place] [address]    LABORGA STAPHYLOCOCCUS AUREUS 05/05/2017    Orders:  No orders of the defined types were placed in this encounter.  No orders of the defined types were placed in this encounter.    Procedures: No procedures performed  Clinical Data: No additional findings.  ROS:  All other systems negative, except as noted in the HPI. Review of Systems  Objective: Vital Signs: There were no vitals taken for this visit.  Specialty Comments:  No specialty comments available.  PMFS History: Patient Active Problem List   Diagnosis Date Noted  . Amputated great toe, left (HCC) 05/29/2017  . Cellulitis in diabetic foot (HCC) 05/05/2017  . Osteomyelitis (HCC) 05/05/2017  . Newly diagnosed diabetes (HCC) 05/05/2017  . Hyponatremia 05/05/2017  . Diabetic foot ulcer (HCC) 05/04/2017   History reviewed. No pertinent past medical history.  Family History  Problem Relation Age of Onset  . Family history unknown: Yes    Past Surgical History:  Procedure Laterality Date  . AMPUTATION Left 05/06/2017   Procedure: AMPUTATION GREAT TOE;  Surgeon: Beverely Low, MD;  Location: [facility];  Service: Orthopedics;  Laterality: Left;  . AMPUTATION Left 05/08/2017   Procedure: Left 1st Ray Amputation;  Surgeon: Aldean Baker  V, MD;  Location: [facility] OR;  Service: Orthopedics;  Laterality: Left;  . HAND SURGERY Left   . TONSILLECTOMY     Social History   Occupational History  . Not on file.   Social History Main Topics  . Smoking status: Former Games developermoker  . Smokeless tobacco: Never Used  . Alcohol use No  . Drug use: No  . Sexual activity: Not on file

## 2017-07-06 ENCOUNTER — Ambulatory Visit (INDEPENDENT_AMBULATORY_CARE_PROVIDER_SITE_OTHER): Payer: Self-pay | Admitting: Orthopedic Surgery

## 2017-07-06 ENCOUNTER — Encounter (INDEPENDENT_AMBULATORY_CARE_PROVIDER_SITE_OTHER): Payer: Self-pay | Admitting: Orthopedic Surgery

## 2017-07-06 VITALS — Ht 72.0 in | Wt 270.0 lb

## 2017-07-06 DIAGNOSIS — Z89412 Acquired absence of left great toe: Secondary | ICD-10-CM

## 2017-07-06 DIAGNOSIS — S98112A Complete traumatic amputation of left great toe, initial encounter: Secondary | ICD-10-CM

## 2017-07-06 MED ORDER — GABAPENTIN 100 MG PO CAPS: 100.0000 mg | ORAL_CAPSULE | Freq: Three times a day (TID) | ORAL | 1 refills | Status: DC

## 2017-07-06 NOTE — Progress Notes (Signed)
   Post-Op Visit Note   Patient: Russell Merritt           Date of Birth: Feb 01, 1966           MRN: [id number] Visit Date: 07/06/2017 PCP: Monica Bectonhekkekandam, Thomas J, MD  Chief Complaint:  Chief Complaint  Patient presents with  . Left Foot - Routine Post Op    05/08/17 1st ray amputation     HPI:  The patient is 51 year old gentleman who is seen today 2 months status post left first ray amputation states that he had oozing drainage has been using Neosporin dressings daily uses a compression sock however he is not wearing today. There is no dressing in place today.  Sates has been nonweightbearing however he does state that he walks on his heel especially when he goes to [employer]. Complains of increased 8 after having walked across the parking lot.   Does have night stabbing pain. Him pulsating electrical pain.  denies fevers or chills  Ortho Exam The left first ray amputation is dehisced distally. Is open 4 cm x 1 cm. Filled in with fibrinous exudative tissue. Debrided with gauze. Some surrounding erythema. No cellulitis, no drainage, no odor.   Visit Diagnoses:  1. Amputated great toe, left (HCC)     Plan: continue daily wound care. Use dressings daily. No weight bearing. Elevate. Use compression stocking. Follow up in 3 weeks.   Follow-Up Instructions: Return in about 3 weeks (around 07/27/2017).   Imaging: No results found.  Orders:  No orders of the defined types were placed in this encounter.  No orders of the defined types were placed in this encounter.    PMFS History: Patient Active Problem List   Diagnosis Date Noted  . Amputated great toe, left (HCC) 05/29/2017  . Cellulitis in diabetic foot (HCC) 05/05/2017  . Osteomyelitis (HCC) 05/05/2017  . Newly diagnosed diabetes (HCC) 05/05/2017  . Hyponatremia 05/05/2017  . Diabetic foot ulcer (HCC) 05/04/2017   No past medical history on file.  Family History  Problem Relation Age of Onset  . Family history  unknown: Yes    Past Surgical History:  Procedure Laterality Date  . AMPUTATION Left 05/06/2017   Procedure: AMPUTATION GREAT TOE;  Surgeon: Beverely LowNorris, Steve, MD;  Location: [facility];  Service: Orthopedics;  Laterality: Left;  . AMPUTATION Left 05/08/2017   Procedure: Left 1st Ray Amputation;  Surgeon: Nadara Mustarduda, Marcus V, MD;  [facility];  Service: Orthopedics;  Laterality: Left;  . HAND SURGERY Left   . TONSILLECTOMY     Social History   Occupational History  . Not on file.   Social History Main Topics  . Smoking status: Former Games developermoker  . Smokeless tobacco: Never Used  . Alcohol use No  . Drug use: No  . Sexual activity: Not on file

## 2017-07-27 ENCOUNTER — Ambulatory Visit (INDEPENDENT_AMBULATORY_CARE_PROVIDER_SITE_OTHER): Payer: Self-pay | Admitting: Orthopedic Surgery

## 2017-07-27 DIAGNOSIS — L97529 Non-pressure chronic ulcer of other part of left foot with unspecified severity: Secondary | ICD-10-CM

## 2017-07-27 DIAGNOSIS — I87323 Chronic venous hypertension (idiopathic) with inflammation of bilateral lower extremity: Secondary | ICD-10-CM | POA: Insufficient documentation

## 2017-07-27 DIAGNOSIS — Z89412 Acquired absence of left great toe: Secondary | ICD-10-CM

## 2017-07-27 DIAGNOSIS — E11621 Type 2 diabetes mellitus with foot ulcer: Secondary | ICD-10-CM

## 2017-07-27 DIAGNOSIS — S98112A Complete traumatic amputation of left great toe, initial encounter: Secondary | ICD-10-CM

## 2017-07-27 HISTORY — DX: Non-pressure chronic ulcer of other part of left foot with unspecified severity: L97.529

## 2017-07-27 HISTORY — DX: Type 2 diabetes mellitus with foot ulcer: E11.621

## 2017-07-27 NOTE — Progress Notes (Signed)
Office Visit Note   Patient: Russell Merritt           Date of Birth: 10-Apr-1966           MRN: [id number] Visit Date: 07/27/2017              Requested by: Monica Becton, MD [address] [place], [place] [address] PCP: Monica Becton, MD  Chief Complaint  Patient presents with  . 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 Patient has been ambulating in regular shoewear despite recommendations for nonweightbearing and he states he has been wearing his compression stockings but did not wear them today.  Assessment & Plan: Visit Diagnoses:  1. Amputated great toe, left (HCC)   2. 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 Idiopathic chronic venous hypertension of both lower extremities with inflammation     Plan: Recommended better adherence to nonweightbearing discussed the possibility of using a kneeling scooter recommended wearing the compression stocking at all time continue with elevation.  Follow-Up Instructions: Return in about 3 weeks (around 08/17/2017).   Ortho Exam  Patient is alert, oriented, no adenopathy, well-dressed, normal affect, normal respiratory effort. Examination patient has a strong dorsalis pedis pulse. The surgical wound shows swelling dermatitis but no cellulitis the wound has fibrinous tissue which is 3 x 15 mm. There is no odor or drainage no signs of infection.  Imaging: No results found. No images are attached to the encounter.  Labs: Lab Results  Component Value Date   HGBA1C 13.4 (H) 05/07/2017   HGBA1C 13.9 (H) 05/06/2017   ESRSEDRATE 118 (H) 05/04/2017   REPTSTATUS 05/10/2017 FINAL 05/06/2017   REPTSTATUS 05/09/2017 FINAL 05/06/2017   GRAMSTAIN  05/06/2017    RARE WBC PRESENT, PREDOMINANTLY PMN FEW GRAM POSITIVE COCCI IN PAIRS RARE  GRAM NEGATIVE COCCOBACILLI    CULT  05/06/2017    ABUNDANT PREVOTELLA BIVIA BETA LACTAMASE NEGATIVE Performed at [facility], [address], [place], [place] [address]    CULT  05/06/2017    FEW GROUP B STREP(S.AGALACTIAE)ISOLATED TESTING AGAINST S. AGALACTIAE NOT ROUTINELY PERFORMED DUE TO PREDICTABILITY OF AMP/PEN/VAN SUSCEPTIBILITY. Performed at [facility], [address] [place], [place] [address]    LABORGA STAPHYLOCOCCUS AUREUS 05/05/2017    Orders:  No orders of the defined types were placed in this encounter.  No orders of the defined types were placed in this encounter.    Procedures: No procedures performed  Clinical Data: No additional findings.  ROS:  All other systems negative, except as noted in the HPI. Review of Systems  Objective: Vital Signs: There were no vitals taken for this visit.  Specialty Comments:  No specialty comments available.  PMFS History: Patient Active Problem List   Diagnosis Date Noted  . 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 Idiopathic chronic venous hypertension of both lower extremities with inflammation 07/27/2017  . Amputated great toe, left (HCC) 05/29/2017  . Cellulitis in diabetic foot (HCC) 05/05/2017  . Osteomyelitis (HCC) 05/05/2017  . Newly diagnosed diabetes (HCC) 05/05/2017  . Hyponatremia 05/05/2017  . Diabetic foot ulcer (HCC) 05/04/2017   No past medical history on file.  Family History  Problem Relation Age of Onset  . Family  history unknown: Yes    Past Surgical History:  Procedure Laterality Date  . AMPUTATION Left 05/06/2017   Procedure: AMPUTATION GREAT TOE;  Surgeon: Beverely Low, MD;  Location: [facility];  Service: Orthopedics;  Laterality: Left;  . AMPUTATION Left 05/08/2017   Procedure: Left 1st Ray Amputation;  Surgeon: Nadara Mustard, MD;  Location: [facility] OR;  Service: Orthopedics;  Laterality: Left;  . HAND SURGERY Left   .  TONSILLECTOMY     Social History   Occupational History  . Not on file.   Social History Main Topics  . Smoking status: Former Games developer  . Smokeless tobacco: Never Used  . Alcohol use No  . Drug use: No  . Sexual activity: Not on file

## 2017-08-17 ENCOUNTER — Ambulatory Visit (INDEPENDENT_AMBULATORY_CARE_PROVIDER_SITE_OTHER): Payer: 59 | Admitting: Orthopedic Surgery

## 2017-08-17 ENCOUNTER — Encounter (INDEPENDENT_AMBULATORY_CARE_PROVIDER_SITE_OTHER): Payer: Self-pay | Admitting: Orthopedic Surgery

## 2017-08-17 DIAGNOSIS — L97529 Non-pressure chronic ulcer of other part of left foot with unspecified severity: Secondary | ICD-10-CM

## 2017-08-17 DIAGNOSIS — Z89412 Acquired absence of left great toe: Secondary | ICD-10-CM

## 2017-08-17 DIAGNOSIS — I87323 Chronic venous hypertension (idiopathic) with inflammation of bilateral lower extremity: Secondary | ICD-10-CM | POA: Diagnosis not present

## 2017-08-17 DIAGNOSIS — E11621 Type 2 diabetes mellitus with foot ulcer: Secondary | ICD-10-CM | POA: Diagnosis not present

## 2017-08-17 DIAGNOSIS — S98112A Complete traumatic amputation of left great toe, initial encounter: Secondary | ICD-10-CM

## 2017-08-17 NOTE — Progress Notes (Signed)
Office Visit Note   Patient: Russell Merritt           Date of Birth: 1966/05/11           MRN: [id number] Visit Date: 08/17/2017              Requested by: Monica Becton, MD [address] [place], [place] [address] PCP: Monica Becton, MD  Chief Complaint  Patient presents with  . Left Foot - Follow-up      HPI: Patient is a 51 year old gentleman who is 3-1/2 months status post left foot first ray amputation. Patient states he still has some drainage from the surgical incision complains of swelling states that he does wear his compression stockings but is not wearing them today. Patient is using antibiotic ointment to the wound and washing with soap and water.  Assessment & Plan: Visit Diagnoses:  1. Amputated great toe, left (HCC)   2. Idiopathic chronic venous hypertension of both lower extremities with inflammation   3. Diabetic ulcer of toe of left foot associated with type 2 diabetes mellitus, unspecified ulcer stage (HCC)     Plan: Recommended continue minimizing weightbearing continue elevation continue with the compression stockings continue with dialysis of cleansing and antibiotic ointment dressing changes. At follow-up in 4 weeks' anticipate the patient should be able to return to work.  Follow-Up Instructions: Return in about 4 weeks (around 09/14/2017).   Ortho Exam  Patient is alert, oriented, no adenopathy, well-dressed, normal affect, normal respiratory effort. Examination patient has an antalgic gait. Examination he has venous stasis swelling and brawny skin color changes but no open venous stasis ulcers. Patient does have a palpable pulse. The ulcer after debridement shows improved granulation tissue the wound is approximately 3 mm x 20 mm and 1 mm deep. There is no cellulitis no odor no drainage no signs of infection.  Imaging: No results found. No images are attached to the encounter.  Labs: Lab Results  Component Value  Date   HGBA1C 13.4 (H) 05/07/2017   HGBA1C 13.9 (H) 05/06/2017   ESRSEDRATE 118 (H) 05/04/2017   REPTSTATUS 05/10/2017 FINAL 05/06/2017   REPTSTATUS 05/09/2017 FINAL 05/06/2017   GRAMSTAIN  05/06/2017    RARE WBC PRESENT, PREDOMINANTLY PMN FEW GRAM POSITIVE COCCI IN PAIRS RARE GRAM NEGATIVE COCCOBACILLI    CULT  05/06/2017    ABUNDANT PREVOTELLA BIVIA BETA LACTAMASE NEGATIVE Performed at [facility], [address], [place], [place] [address]    CULT  05/06/2017    FEW GROUP B STREP(S.AGALACTIAE)ISOLATED TESTING AGAINST S. AGALACTIAE NOT ROUTINELY PERFORMED DUE TO PREDICTABILITY OF AMP/PEN/VAN SUSCEPTIBILITY. Performed at [facility], [address] [place], [place] [address]    LABORGA STAPHYLOCOCCUS AUREUS 05/05/2017    Orders:  No orders of the defined types were placed in this encounter.  No orders of the defined types were placed in this encounter.    Procedures: No procedures performed  Clinical Data: No additional findings.  ROS:  All other systems negative, except as noted in the HPI. Review of Systems  Objective: Vital Signs: There were no vitals taken for this visit.  Specialty Comments:  No specialty comments available.  PMFS History: Patient Active Problem List   Diagnosis Date Noted  . 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 Idiopathic chronic venous hypertension of both lower extremities with inflammation 07/27/2017  . Amputated great toe, left (HCC) 05/29/2017  .  Cellulitis in diabetic foot (HCC) 05/05/2017  . Osteomyelitis (HCC) 05/05/2017  . Newly diagnosed diabetes (HCC) 05/05/2017  . Hyponatremia 05/05/2017  . Diabetic foot ulcer (HCC) 05/04/2017   No past medical history on file.  Family History  Problem Relation Age of Onset  . Family history unknown: Yes    Past Surgical History:  Procedure Laterality Date  . AMPUTATION Left 05/06/2017   Procedure: AMPUTATION  GREAT TOE;  Surgeon: Beverely Low, MD;  Location: [facility];  Service: Orthopedics;  Laterality: Left;  . AMPUTATION Left 05/08/2017   Procedure: Left 1st Ray Amputation;  Surgeon: Nadara Mustard, MD;  [facility]: [facility] OR;  Service: Orthopedics;  Laterality: Left;  . HAND SURGERY Left   . TONSILLECTOMY     Social History   Occupational History  . Not on file.   Social History Main Topics  . Smoking status: Former Games developer  . Smokeless tobacco: Never Used  . Alcohol use No  . Drug use: No  . Sexual activity: Not on file

## 2017-09-14 ENCOUNTER — Encounter (INDEPENDENT_AMBULATORY_CARE_PROVIDER_SITE_OTHER): Payer: Self-pay | Admitting: Orthopedic Surgery

## 2017-09-14 ENCOUNTER — Ambulatory Visit (INDEPENDENT_AMBULATORY_CARE_PROVIDER_SITE_OTHER): Payer: 59 | Admitting: Orthopedic Surgery

## 2017-09-14 VITALS — Ht 72.0 in | Wt 270.0 lb

## 2017-09-14 DIAGNOSIS — I87323 Chronic venous hypertension (idiopathic) with inflammation of bilateral lower extremity: Secondary | ICD-10-CM | POA: Diagnosis not present

## 2017-09-14 DIAGNOSIS — S98112A Complete traumatic amputation of left great toe, initial encounter: Secondary | ICD-10-CM

## 2017-09-14 DIAGNOSIS — Z89412 Acquired absence of left great toe: Secondary | ICD-10-CM | POA: Diagnosis not present

## 2017-09-14 DIAGNOSIS — L97529 Non-pressure chronic ulcer of other part of left foot with unspecified severity: Secondary | ICD-10-CM | POA: Diagnosis not present

## 2017-09-14 DIAGNOSIS — E11621 Type 2 diabetes mellitus with foot ulcer: Secondary | ICD-10-CM

## 2017-09-14 MED ORDER — SULFAMETHOXAZOLE-TRIMETHOPRIM 800-160 MG PO TABS: 1.0000 | ORAL_TABLET | Freq: Two times a day (BID) | ORAL | 0 refills | Status: DC

## 2017-09-14 NOTE — Progress Notes (Signed)
Office Visit Note   Patient: Russell Merritt           Date of Birth: [birth year]           MRN: [id number] Visit Date: 09/14/2017              Requested by: Monica Becton, MD [address] [place], [place] [address] PCP: Monica Becton, MD  Chief Complaint  Patient presents with  . Left Foot - Follow-up      HPI: Patient is a 51 year old gentleman who is status post left foot first ray amputation 05/08/17. Patient states he still has some drainage from the surgical incision complains of swelling states that he does wear his compression stockings but is not wearing them today. Patient is using antibiotic ointment to the wound and washing with soap and water. Is in a cotton sock today, states is not covering ulcer with dressing.  Patient states ulcer has improved in size.  Assessment & Plan: Visit Diagnoses:  1. Amputated great toe, left (HCC)   2. Idiopathic chronic venous hypertension of both lower extremities with inflammation   3. 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 continue elevation. continue with the compression stockings continue with daily of cleansing and antibiotic ointment dressing changes. Do apply dressing, pack open. At follow-up in 4 weeks' hope the patient will be able to return to work.  Follow-Up Instructions: Return in about 4 weeks (around 10/12/2017).   Ortho Exam  Patient is alert, oriented, no adenopathy, well-dressed, normal affect, normal respiratory effort. Examination patient has an antalgic gait. Examination he has venous stasis swelling and brawny skin color changes but no open venous stasis ulcers. Patient does have a palpable pulse. The ulcer after debridement shows improved granulation tissue the wound is approximately 3 mm x 20 mm and 1 mm deep. Erythema and swelling to forefoot today. No odor  no drainage on exam.  Imaging: No results found. No images are attached to the encounter.  Labs: Lab Results  Component Value Date   HGBA1C 13.4 (H) 05/07/2017   HGBA1C 13.9 (H) 05/06/2017   ESRSEDRATE 118 (H) 05/04/2017   REPTSTATUS 05/10/2017 FINAL 05/06/2017   REPTSTATUS 05/09/2017 FINAL 05/06/2017   GRAMSTAIN  05/06/2017    RARE WBC PRESENT, PREDOMINANTLY PMN FEW GRAM POSITIVE COCCI IN PAIRS RARE GRAM NEGATIVE COCCOBACILLI    CULT  05/06/2017    ABUNDANT PREVOTELLA BIVIA BETA LACTAMASE NEGATIVE Performed at [facility], [address], [place], [place] [address]    CULT  05/06/2017    FEW GROUP B STREP(S.AGALACTIAE)ISOLATED TESTING AGAINST S. AGALACTIAE NOT ROUTINELY PERFORMED DUE TO PREDICTABILITY OF AMP/PEN/VAN SUSCEPTIBILITY. Performed at [facility], [address], [place], [place] [address]    LABORGA STAPHYLOCOCCUS AUREUS 05/05/2017    Orders:  No orders of the defined types were placed in this encounter.  No orders of the defined types were placed in this encounter.    Procedures: No procedures performed  Clinical Data: No additional findings.  ROS:  All other systems negative, except as noted in the HPI. Review of Systems  Constitutional: Negative for chills and fever.  Skin: Positive for color change and wound.    Objective: Vital Signs: Ht 6' (1.829 m)   Wt 270 lb (122.5 kg)   BMI 36.62 kg/m   Specialty Comments:  No specialty comments  available.  PMFS History: Patient Active Problem List   Diagnosis Date Noted  . 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 Idiopathic chronic venous hypertension of both lower extremities with inflammation 07/27/2017  . Amputated great toe, left (HCC) 05/29/2017  . Cellulitis in diabetic foot (HCC) 05/05/2017  . Osteomyelitis (HCC) 05/05/2017  . Newly diagnosed diabetes (HCC) 05/05/2017  . Hyponatremia 05/05/2017  . Diabetic foot  ulcer (HCC) 05/04/2017   No past medical history on file.  Family History  Problem Relation Age of Onset  . Family history unknown: Yes    Past Surgical History:  Procedure Laterality Date  . AMPUTATION Left 05/06/2017   Procedure: AMPUTATION GREAT TOE;  Surgeon: Beverely Low, MD;  Location: [facility];  Service: Orthopedics;  Laterality: Left;  . AMPUTATION Left 05/08/2017   Procedure: Left 1st Ray Amputation;  Surgeon: Nadara Mustard, MD;  Location: [facility] OR;  Service: Orthopedics;  Laterality: Left;  . HAND SURGERY Left   . TONSILLECTOMY     Social History   Occupational History  . Not on file.   Social History Main Topics  . Smoking status: Former Games developer  . Smokeless tobacco: Never Used  . Alcohol use No  . Drug use: No  . Sexual activity: Not on file

## 2017-10-01 ENCOUNTER — Telehealth (INDEPENDENT_AMBULATORY_CARE_PROVIDER_SITE_OTHER): Payer: Self-pay | Admitting: Orthopedic Surgery

## 2017-10-01 NOTE — Telephone Encounter (Signed)
Lupita LeashDonna @ [facility] called checking on a request for records. I called her back [phone],tolf her I did not their request and verified our fax number. She is going to re fax

## 2017-10-12 ENCOUNTER — Ambulatory Visit (INDEPENDENT_AMBULATORY_CARE_PROVIDER_SITE_OTHER): Payer: 59 | Admitting: Family

## 2017-10-12 ENCOUNTER — Encounter (INDEPENDENT_AMBULATORY_CARE_PROVIDER_SITE_OTHER): Payer: Self-pay | Admitting: Family

## 2017-10-12 ENCOUNTER — Ambulatory Visit (INDEPENDENT_AMBULATORY_CARE_PROVIDER_SITE_OTHER): Payer: 59

## 2017-10-12 VITALS — Ht 72.0 in | Wt 270.0 lb

## 2017-10-12 DIAGNOSIS — E11621 Type 2 diabetes mellitus with foot ulcer: Secondary | ICD-10-CM

## 2017-10-12 DIAGNOSIS — Z89412 Acquired absence of left great toe: Secondary | ICD-10-CM

## 2017-10-12 DIAGNOSIS — S98112A Complete traumatic amputation of left great toe, initial encounter: Secondary | ICD-10-CM

## 2017-10-12 DIAGNOSIS — L97529 Non-pressure chronic ulcer of other part of left foot with unspecified severity: Secondary | ICD-10-CM

## 2017-10-12 MED ORDER — NITROGLYCERIN 0.2 MG/HR TD PT24: 0.2000 mg | MEDICATED_PATCH | Freq: Every day | TRANSDERMAL | 12 refills | Status: DC

## 2017-10-12 NOTE — Progress Notes (Signed)
Office Visit Note   Patient: Russell Merritt           Date of Birth: 12-Feb-1966           MRN: [id number] Visit Date: 10/12/2017              Requested by: Monica Bectonhekkekandam, Thomas J, MD [address] [place], [place] [address] PCP: Monica Bectonhekkekandam, Thomas J, MD  Chief Complaint  Patient presents with  . Left Foot - Follow-up    05/08/17 1st ray amputation       HPI: Patient is a 51 year old gentleman who is status post left foot first ray amputation 05/08/17. Patient states he still has little clear drainage from the surgical incision complains of swelling states that he does wear his compression stockings but is not wearing them today. Patient is using antibiotic ointment to the wound and washing with soap and water. Is in a cotton sock today, states is not covering ulcer with dressing.  Patient states ulcer has decreased in size.  Assessment & Plan: Visit Diagnoses:  1. Amputated great toe, left (HCC)   2. Diabetic ulcer of toe of left foot associated with type 2 diabetes mellitus, unspecified ulcer stage (HCC)     Plan: will start the Nitroglycerin patches daily. Recommended continue minimizing weightbearing. continue elevation. continue with the compression stockings continue with daily of cleansing and antibiotic ointment dressing changes. Do apply dressing, pack open. At follow-up in 4 weeks' hope the patient will be able to return to work.  Follow-Up Instructions: Return in about 4 weeks (around 11/09/2017).   Ortho Exam  Patient is alert, oriented, no adenopathy, well-dressed, normal affect, normal respiratory effort. Examination patient has an antalgic gait. Examination he has venous stasis swelling and brawny skin color changes but no open venous stasis ulcers. Patient does have a palpable pulse. The ulcer after debridement shows improved granulation tissue the wound is approximately 1 mm x 15 mm and 3 mm deep. Mild erythema, no cellulitis. No odor.  Imaging: Xr  Foot 2 Views Left  Result Date: 10/12/2017 Radiographs of left foot show stable alignment status post first ray amputation. No destructive changes. No sign of osteomyelitis. Silver nitrate wound probing seen.  No images are attached to the encounter.  Labs: Lab Results  Component Value Date   HGBA1C 13.4 (H) 05/07/2017   HGBA1C 13.9 (H) 05/06/2017   ESRSEDRATE 118 (H) 05/04/2017   REPTSTATUS 05/10/2017 FINAL 05/06/2017   REPTSTATUS 05/09/2017 FINAL 05/06/2017   GRAMSTAIN  05/06/2017    RARE WBC PRESENT, PREDOMINANTLY PMN FEW GRAM POSITIVE COCCI IN PAIRS RARE GRAM NEGATIVE COCCOBACILLI    CULT  05/06/2017    ABUNDANT PREVOTELLA BIVIA BETA LACTAMASE NEGATIVE Performed at [facility], [address] [place], [place] [address]    CULT  05/06/2017    FEW GROUP B STREP(S.AGALACTIAE)ISOLATED TESTING AGAINST S. AGALACTIAE NOT ROUTINELY PERFORMED DUE TO PREDICTABILITY OF AMP/PEN/VAN SUSCEPTIBILITY. Performed at [facility], [address] [place], [place] [address]    LABORGA STAPHYLOCOCCUS AUREUS 05/05/2017    Orders:  Orders Placed This Encounter  Procedures  . XR Foot 2 Views Left   Meds ordered this encounter  Medications  . nitroGLYCERIN (NITRODUR - DOSED IN MG/24 HR) 0.2 mg/hr patch    Sig: Place 1 patch (0.2 mg total) daily onto the skin.    Dispense:  30 patch    Refill:  12     Procedures: No procedures performed  Clinical  Data: No additional findings.  ROS:  All other systems negative, except as noted in the HPI. Review of Systems  Constitutional: Negative for chills and fever.  Skin: Positive for color change and wound.    Objective: Vital Signs: Ht 6' (1.829 m)   Wt 270 lb (122.5 kg)   BMI 36.62 kg/m   Specialty Comments:  No specialty comments available.  PMFS History: Patient Active Problem List   Diagnosis Date Noted  . 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  Idiopathic chronic venous hypertension of both lower extremities with inflammation 07/27/2017  . Amputated great toe, left (HCC) 05/29/2017  . Cellulitis in diabetic foot (HCC) 05/05/2017  . Osteomyelitis (HCC) 05/05/2017  . Newly diagnosed diabetes (HCC) 05/05/2017  . Hyponatremia 05/05/2017  . Diabetic foot ulcer (HCC) 05/04/2017   History reviewed. No pertinent past medical history.  Family History  Family history unknown: Yes    Past Surgical History:  Procedure Laterality Date  . HAND SURGERY Left   . TONSILLECTOMY     Social History   Occupational History  . Not on file  Tobacco Use  . Smoking status: Former Games developermoker  . Smokeless tobacco: Never Used  Substance and Sexual Activity  . Alcohol use: No  . Drug use: No  . Sexual activity: Not on file

## 2017-11-13 ENCOUNTER — Ambulatory Visit (INDEPENDENT_AMBULATORY_CARE_PROVIDER_SITE_OTHER): Payer: 59 | Admitting: Family

## 2017-11-13 ENCOUNTER — Encounter (INDEPENDENT_AMBULATORY_CARE_PROVIDER_SITE_OTHER): Payer: Self-pay | Admitting: Family

## 2017-11-13 VITALS — Ht 72.0 in | Wt 270.0 lb

## 2017-11-13 DIAGNOSIS — L97529 Non-pressure chronic ulcer of other part of left foot with unspecified severity: Secondary | ICD-10-CM

## 2017-11-13 DIAGNOSIS — E11621 Type 2 diabetes mellitus with foot ulcer: Secondary | ICD-10-CM

## 2017-11-13 DIAGNOSIS — Z89412 Acquired absence of left great toe: Secondary | ICD-10-CM | POA: Diagnosis not present

## 2017-11-13 DIAGNOSIS — S98112A Complete traumatic amputation of left great toe, initial encounter: Secondary | ICD-10-CM

## 2017-11-13 NOTE — Progress Notes (Signed)
Office Visit Note   Patient: Russell Merritt           Date of Birth: 1966/07/12           MRN: [id number] Visit Date: 11/13/2017              Requested by: Monica Bectonhekkekandam, Thomas J, MD [address] [place] [address] [place], [place] [address] PCP: Monica Bectonhekkekandam, Thomas J, MD  Chief Complaint  Patient presents with  . Left Foot - Follow-up    05/08/17  Left foot 1st ray amputation 05/08/17      HPI: Patient is a 51 year old gentleman who is status post left foot first ray amputation 05/08/17. Patient is using antibiotic ointment to the wound and washing with soap and water.pleased with progress. Wonders when can resume full weight bearing. Does have some pain over incision site.   Assessment & Plan: Visit Diagnoses:  1. Diabetic ulcer of toe of left foot associated with type 2 diabetes mellitus, unspecified ulcer stage (HCC)   2. Amputated great toe, left (HCC)     Plan: continue Nitroglycerin patches daily. May resume WBAT. continue elevation. continue with the compression stockings continue with daily of cleansing and antibiotic ointment dressing changes. At follow-up in 4 weeks' hope the patient will be able to return to work.  Follow-Up Instructions: Return in about 4 weeks (around 12/11/2017).   Ortho Exam  Patient is alert, oriented, no adenopathy, well-dressed, normal affect, normal respiratory effort. Examination patient has an antalgic gait. Examination he has venous stasis swelling and brawny skin color changes but no open venous stasis ulcers. Patient does have a palpable pulse. Ulcer much reduced in size, is 5 mm long and 1 mm wide, 2 mm deep. Mild erythema, no cellulitis. No odor.  Imaging: No results found. No images are attached to the encounter.  Labs: Lab Results  Component Value Date   HGBA1C 13.4 (H) 05/07/2017   HGBA1C 13.9 (H) 05/06/2017   ESRSEDRATE 118 (H) 05/04/2017   REPTSTATUS 05/10/2017 FINAL 05/06/2017   REPTSTATUS 05/09/2017 FINAL 05/06/2017   GRAMSTAIN  05/06/2017    RARE WBC PRESENT, PREDOMINANTLY PMN FEW GRAM POSITIVE COCCI IN PAIRS RARE GRAM NEGATIVE COCCOBACILLI    CULT  05/06/2017    ABUNDANT PREVOTELLA BIVIA BETA LACTAMASE NEGATIVE Performed at [facility], [address] [place], [place] [address]    CULT  05/06/2017    FEW GROUP B STREP(S.AGALACTIAE)ISOLATED TESTING AGAINST S. AGALACTIAE NOT ROUTINELY PERFORMED DUE TO PREDICTABILITY OF AMP/PEN/VAN SUSCEPTIBILITY. Performed at [facility], [address] [place], [place] [address]    LABORGA STAPHYLOCOCCUS AUREUS 05/05/2017    Orders:  No orders of the defined types were placed in this encounter.  No orders of the defined types were placed in this encounter.    Procedures: No procedures performed  Clinical Data: No additional findings.  ROS:  All other systems negative, except as noted in the HPI. Review of Systems  Constitutional: Negative for chills and fever.  Skin: Positive for color change and wound.    Objective: Vital Signs: Ht 6' (1.829 m)   Wt 270 lb (122.5 kg)   BMI 36.62 kg/m   Specialty Comments:  No specialty comments available.  PMFS History: Patient Active Problem List   Diagnosis Date Noted  . 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 Idiopathic chronic venous hypertension of both lower extremities with inflammation 07/27/2017  . Amputated great toe,  left (HCC) 05/29/2017  . Cellulitis in diabetic foot (HCC) 05/05/2017  . Osteomyelitis (HCC) 05/05/2017  . Newly diagnosed diabetes (HCC) 05/05/2017  . Hyponatremia 05/05/2017  . Diabetic foot ulcer (HCC) 05/04/2017   History reviewed. No pertinent past medical history.  Family History  Family history unknown: Yes    Past Surgical History:  Procedure Laterality Date  . AMPUTATION Left 05/06/2017   Procedure: AMPUTATION GREAT TOE;  Surgeon: Beverely LowNorris, Steve, MD;  Location: [facility];  Service: Orthopedics;   Laterality: Left;  . AMPUTATION Left 05/08/2017   Procedure: Left 1st Ray Amputation;  Surgeon: Nadara Mustarduda, Marcus V, MD;  [facility];  Service: Orthopedics;  Laterality: Left;  . HAND SURGERY Left   . TONSILLECTOMY     Social History   Occupational History  . Not on file  Tobacco Use  . Smoking status: Former Games developermoker  . Smokeless tobacco: Never Used  Substance and Sexual Activity  . Alcohol use: No  . Drug use: No  . Sexual activity: Not on file

## 2017-12-14 ENCOUNTER — Encounter (INDEPENDENT_AMBULATORY_CARE_PROVIDER_SITE_OTHER): Payer: Self-pay | Admitting: Family

## 2017-12-14 ENCOUNTER — Ambulatory Visit (INDEPENDENT_AMBULATORY_CARE_PROVIDER_SITE_OTHER): Payer: 59 | Admitting: Orthopedic Surgery

## 2017-12-14 DIAGNOSIS — I87323 Chronic venous hypertension (idiopathic) with inflammation of bilateral lower extremity: Secondary | ICD-10-CM | POA: Diagnosis not present

## 2017-12-14 DIAGNOSIS — L03116 Cellulitis of left lower limb: Secondary | ICD-10-CM

## 2017-12-14 MED ORDER — DOXYCYCLINE HYCLATE 100 MG PO TABS: 100.0000 mg | ORAL_TABLET | Freq: Two times a day (BID) | ORAL | 0 refills | Status: DC

## 2017-12-14 NOTE — Progress Notes (Signed)
Office Visit Note   Patient: Russell Merritt           Date of Birth: 1966/02/11           MRN: [id number] Visit Date: 12/14/2017              Requested by: Monica Becton, MD [address] [place] [address] [place], [place] [address] PCP: Monica Becton, MD  Chief Complaint  Patient presents with  . Left Foot - Follow-up    05/08/17 Left Foot 1st Ray Amputation      HPI: Patient is a 52 year old gentleman who presents approximately 7 months status post left foot first ray amputation.  Patient complains of pain after prolonged weightbearing along the medial column.  Patient has completed his oral antibiotics he has been using a nitroglycerin patch  Assessment & Plan: Visit Diagnoses:  1. 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 Cellulitis of foot, left     Plan: Patient is a pain after prolonged ambulation the redness and swelling is concerning for possible infection.  We will place him on doxycycline he has taken Septra in the past.  Follow-up in 2 weeks.  He will discontinue the nitroglycerin patch.  Follow-Up Instructions: Return in about 2 weeks (around 12/28/2017).   Ortho Exam  Patient is alert, oriented, no adenopathy, well-dressed, normal affect, normal respiratory effort. Examination the incision is well-healed he does have some redness and swelling over the distal aspect of the medial ray amputation the area of redness is about 2 cm in diameter there is no red streaking.  There is some mild tenderness to palpation there is no drainage he does have some swelling of the second toe as well.  There are no ischemic changes to the foot.  Patient has an antalgic gait.  Imaging: No results found. No images are attached to the encounter.  Labs: Lab Results  Component Value Date   HGBA1C 13.4 (H) 05/07/2017   HGBA1C 13.9 (H) 05/06/2017   ESRSEDRATE 118 (H) 05/04/2017   REPTSTATUS 05/10/2017 FINAL 05/06/2017   REPTSTATUS 05/09/2017 FINAL 05/06/2017   GRAMSTAIN  05/06/2017    RARE WBC PRESENT, PREDOMINANTLY PMN FEW GRAM POSITIVE COCCI IN PAIRS RARE GRAM NEGATIVE COCCOBACILLI    CULT  05/06/2017    ABUNDANT PREVOTELLA BIVIA BETA LACTAMASE NEGATIVE Performed at [facility], [address], [place], [place] [address]    CULT  05/06/2017    FEW GROUP B STREP(S.AGALACTIAE)ISOLATED TESTING AGAINST S. AGALACTIAE NOT ROUTINELY PERFORMED DUE TO PREDICTABILITY OF AMP/PEN/VAN SUSCEPTIBILITY. Performed at [facility], [address] [place], [place] [address]    LABORGA STAPHYLOCOCCUS AUREUS 05/05/2017    @LABSALLVALUES (HGBA1)@  There is no height or weight on file to calculate BMI.  Orders:  No orders of the defined types were placed in this encounter.  Meds ordered this encounter  Medications  . doxycycline (VIBRA-TABS) 100 MG tablet    Sig: Take 1 tablet (100 mg total) by mouth 2 (two) times daily.    Dispense:  60 tablet    Refill:  0     Procedures: No procedures performed  Clinical Data: No additional findings.  ROS:  All other systems negative, except as noted in the HPI. Review of Systems  Objective: Vital Signs: There were no vitals taken for this visit.  Specialty Comments:  No specialty comments available.  PMFS History: Patient Active Problem List   Diagnosis Date Noted  .  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 Idiopathic chronic venous hypertension of both lower extremities with inflammation 07/27/2017  . Amputated great toe, left (HCC) 05/29/2017  . Cellulitis in diabetic foot (HCC) 05/05/2017  . Osteomyelitis (HCC) 05/05/2017  . Newly diagnosed diabetes (HCC) 05/05/2017  . Hyponatremia 05/05/2017  . Diabetic foot ulcer (HCC) 05/04/2017   History reviewed. No pertinent past medical history.  Family History  Family history unknown: Yes    Past Surgical History:  Procedure Laterality  Date  . AMPUTATION Left 05/06/2017   Procedure: AMPUTATION GREAT TOE;  Surgeon: Beverely LowNorris, Steve, MD;  Location: [facility];  Service: Orthopedics;  Laterality: Left;  . AMPUTATION Left 05/08/2017   Procedure: Left 1st Ray Amputation;  Surgeon: Nadara Mustarduda, Jnya Brossard V, MD;  [facility];  Service: Orthopedics;  Laterality: Left;  . HAND SURGERY Left   . TONSILLECTOMY     Social History   Occupational History  . Not on file  Tobacco Use  . Smoking status: Former Games developermoker  . Smokeless tobacco: Never Used  Substance and Sexual Activity  . Alcohol use: No  . Drug use: No  . Sexual activity: Not on file

## 2017-12-28 ENCOUNTER — Telehealth (INDEPENDENT_AMBULATORY_CARE_PROVIDER_SITE_OTHER): Payer: Self-pay | Admitting: Orthopedic Surgery

## 2017-12-28 ENCOUNTER — Ambulatory Visit (INDEPENDENT_AMBULATORY_CARE_PROVIDER_SITE_OTHER): Payer: 59 | Admitting: Orthopedic Surgery

## 2017-12-28 ENCOUNTER — Encounter (INDEPENDENT_AMBULATORY_CARE_PROVIDER_SITE_OTHER): Payer: Self-pay | Admitting: Orthopedic Surgery

## 2017-12-28 DIAGNOSIS — Z89412 Acquired absence of left great toe: Secondary | ICD-10-CM | POA: Diagnosis not present

## 2017-12-28 DIAGNOSIS — S98112A Complete traumatic amputation of left great toe, initial encounter: Secondary | ICD-10-CM

## 2017-12-28 DIAGNOSIS — I87323 Chronic venous hypertension (idiopathic) with inflammation of bilateral lower extremity: Secondary | ICD-10-CM | POA: Diagnosis not present

## 2017-12-28 NOTE — Telephone Encounter (Signed)
Patient states he needs a work note writing him out since his surgery. Please let him know once finished # [phone]

## 2017-12-28 NOTE — Progress Notes (Signed)
Office Visit Note   Patient: Russell Merritt           Date of Birth: Mar 08, 1966           MRN: [id number] Visit Date: 12/28/2017              Requested by: Russell Becton, MD [address] [place] [address] [place], [place] [address] PCP: Russell Becton, MD  Chief Complaint  Patient presents with  . Left Foot - Follow-up      HPI: Patient is a 52 year old gentleman who presents 7 months status post left foot first ray amputation he does have pitting edema in both lower extremities with dependent redness and swelling.  Patient has completed a course of doxycycline.  Assessment & Plan: Visit Diagnoses:  1. 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 Amputated great toe, left (HCC)     Plan: Recommended knee-high 15-20 mm of compression medical stockings to be worn daily he was given a prescription to go to [facility] discount medical follow-up in 2 weeks.  Patient states he does not feel safe to return to work at this time we will reevaluate and see how he is doing with the medical compression stockings.  I feel that once patient's swelling has decreased he would be safe to return to work.  Follow-Up Instructions: Return in about 2 weeks (around 01/11/2018).   Ortho Exam  Patient is alert, oriented, no adenopathy, well-dressed, normal affect, normal respiratory effort. Examination patient has an antalgic gait he has significant pitting edema in both lower extremities with pitting edema up to the tibial tubercle.  There is brawny skin color changes there is no cellulitis.  He has dependent redness with elevation and compression the redness in the foot resolved.  There is no drainage no cellulitis no tenderness to palpation no open ulcers.  Imaging: No results found. No images are attached to the encounter.  Labs: Lab Results  Component Value Date   HGBA1C 13.4 (H) 05/07/2017   HGBA1C 13.9 (H) 05/06/2017   ESRSEDRATE 118  (H) 05/04/2017   REPTSTATUS 05/10/2017 FINAL 05/06/2017   REPTSTATUS 05/09/2017 FINAL 05/06/2017   GRAMSTAIN  05/06/2017    RARE WBC PRESENT, PREDOMINANTLY PMN FEW GRAM POSITIVE COCCI IN PAIRS RARE GRAM NEGATIVE COCCOBACILLI    CULT  05/06/2017    ABUNDANT PREVOTELLA BIVIA BETA LACTAMASE NEGATIVE Performed at [facility], [address], [place], [place] [address]    CULT  05/06/2017    FEW GROUP B STREP(S.AGALACTIAE)ISOLATED TESTING AGAINST S. AGALACTIAE NOT ROUTINELY PERFORMED DUE TO PREDICTABILITY OF AMP/PEN/VAN SUSCEPTIBILITY. Performed at [facility], [address] [place], [place] [address]    LABORGA STAPHYLOCOCCUS AUREUS 05/05/2017    @LABSALLVALUES (HGBA1)@  There is no height or weight on file to calculate BMI.  Orders:  No orders of the defined types were placed in this encounter.  No orders of the defined types were placed in this encounter.    Procedures: No procedures performed  Clinical Data: No additional findings.  ROS:  All other systems negative, except as noted in the HPI. Review of Systems  Objective: Vital Signs: There were no vitals taken for this visit.  Specialty Comments:  No specialty comments available.  PMFS History: Patient Active Problem List   Diagnosis Date Noted  . 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 Idiopathic chronic venous hypertension of both lower  extremities with inflammation 07/27/2017  . Amputated great toe, left (HCC) 05/29/2017  . Cellulitis in diabetic foot (HCC) 05/05/2017  . Osteomyelitis (HCC) 05/05/2017  . Newly diagnosed diabetes (HCC) 05/05/2017  . Hyponatremia 05/05/2017  . Diabetic foot ulcer (HCC) 05/04/2017   History reviewed. No pertinent past medical history.  Family History  Family history unknown: Yes    Past Surgical History:  Procedure Laterality Date  . AMPUTATION Left 05/06/2017   Procedure: AMPUTATION GREAT TOE;   Surgeon: Beverely LowNorris, Steve, MD;  Location: [facility];  Service: Orthopedics;  Laterality: Left;  . AMPUTATION Left 05/08/2017   Procedure: Left 1st Ray Amputation;  Surgeon: Nadara Mustarduda, Russell V, MD;  [facility];  Service: Orthopedics;  Laterality: Left;  . HAND SURGERY Left   . TONSILLECTOMY     Social History   Occupational History  . Not on file  Tobacco Use  . Smoking status: Former Games developermoker  . Smokeless tobacco: Never Used  Substance and Sexual Activity  . Alcohol use: No  . Drug use: No  . Sexual activity: Not on file

## 2017-12-28 NOTE — Telephone Encounter (Signed)
error 

## 2017-12-29 NOTE — Telephone Encounter (Signed)
I called and advised letter at front desk for pick up.

## 2017-12-30 ENCOUNTER — Telehealth (INDEPENDENT_AMBULATORY_CARE_PROVIDER_SITE_OTHER): Payer: Self-pay | Admitting: Orthopedic Surgery

## 2017-12-30 NOTE — Telephone Encounter (Signed)
[employer] is requesting the following items for pt. Pt will need an updated letter with restrictions and limitations. Pt will need this info by 01/06/18 for disability purposes.   Valentina ShaggyJeremy J. McDaniel  [employer] life [employer] company  [phone] ext:[phone] [email or web address]

## 2017-12-31 NOTE — Telephone Encounter (Signed)
I emailed to [email or web address].

## 2018-01-11 ENCOUNTER — Encounter (INDEPENDENT_AMBULATORY_CARE_PROVIDER_SITE_OTHER): Payer: Self-pay | Admitting: Orthopedic Surgery

## 2018-01-11 ENCOUNTER — Ambulatory Visit (INDEPENDENT_AMBULATORY_CARE_PROVIDER_SITE_OTHER): Payer: 59 | Admitting: Orthopedic Surgery

## 2018-01-11 VITALS — Ht 72.0 in | Wt 270.0 lb

## 2018-01-11 DIAGNOSIS — Z89412 Acquired absence of left great toe: Secondary | ICD-10-CM | POA: Diagnosis not present

## 2018-01-11 DIAGNOSIS — I87323 Chronic venous hypertension (idiopathic) with inflammation of bilateral lower extremity: Secondary | ICD-10-CM

## 2018-01-11 DIAGNOSIS — S98112A Complete traumatic amputation of left great toe, initial encounter: Secondary | ICD-10-CM

## 2018-01-11 NOTE — Progress Notes (Signed)
Office Visit Note   Patient: Russell Merritt           Date of Birth: [birth year]           MRN: [id number] Visit Date: 01/11/2018              Requested by: Monica Bectonhekkekandam, Thomas J, MD [address] [place], [place] [address] PCP: Monica Bectonhekkekandam, Thomas J, MD  Chief Complaint  Patient presents with  . Left Foot - Follow-up    7.5 months out 1st ray amputation       HPI: Patient is a 52 year old gentleman who presents 52 months status post left foot first ray amputation. He has been in Vive compression stockings since last visit. Is full weight bearing in regular shoe wear. Has burning pain along incision and over dorsum of foot. Complains of some increased pain with weight bearing. Is unsure if he will be able to return to work with current pain and difficulty weight bearing.  Works Holiday representativeconstruction. Is unsure if he will ever be painfree or able to do his job as he could before. Difficulty walking on uneven terrain.   Assessment & Plan: Visit Diagnoses:  1. Amputated great toe, left (HCC)   2. Idiopathic chronic venous hypertension of both lower extremities with inflammation     Plan: recommended he continue his compression stockings daily. Did provide an order for custom orthotics to [employer]. Hope this will improve his pain and ability to weight bear. Will follow up in office in 4 weeks. Hope he has gotten his orthotics and will be able to re evaluate for return to work at that time.  Follow-Up Instructions: Return in about 4 weeks (around 02/08/2018).   Ortho Exam  Patient is alert, oriented, no adenopathy, well-dressed, normal affect, normal respiratory effort. Examination patient has an antalgic gait he has trace edema in both lower extremities with pitting edema up to the tibial tubercle.  There is brawny skin color changes there is no cellulitis.  He has dependent redness with elevation redness in the foot resolved.  There is no drainage no cellulitis no  tenderness to palpation no open ulcers.  Imaging: No results found. No images are attached to the encounter.  Labs: Lab Results  Component Value Date   HGBA1C 13.4 (H) 05/07/2017   HGBA1C 13.9 (H) 05/06/2017   ESRSEDRATE 118 (H) 05/04/2017   REPTSTATUS 05/10/2017 FINAL 05/06/2017   REPTSTATUS 05/09/2017 FINAL 05/06/2017   GRAMSTAIN  05/06/2017    RARE WBC PRESENT, PREDOMINANTLY PMN FEW GRAM POSITIVE COCCI IN PAIRS RARE GRAM NEGATIVE COCCOBACILLI    CULT  05/06/2017    ABUNDANT PREVOTELLA BIVIA BETA LACTAMASE NEGATIVE Performed at [facility], [address] [place], [place] [address]    CULT  05/06/2017    FEW GROUP B STREP(S.AGALACTIAE)ISOLATED TESTING AGAINST S. AGALACTIAE NOT ROUTINELY PERFORMED DUE TO PREDICTABILITY OF AMP/PEN/VAN SUSCEPTIBILITY. Performed at [facility], [address] [place], [place] [address]    [facility] 05/05/2017    @LABSALLVALUES (HGBA1)@  Body mass index is 36.62 kg/m.  Orders:  No orders of the defined types were placed in this encounter.  No orders of the defined types were placed in this encounter.    Procedures: No procedures performed  Clinical Data: No additional findings.  ROS:  All other systems negative, except as noted in the HPI. Review of Systems  Constitutional: Negative for chills and fever.  Cardiovascular: Positive for leg swelling.  Skin:  Positive for color change. Negative for wound.  Neurological: Positive for numbness. Negative for weakness.    Objective: Vital Signs: Ht 6' (1.829 m)   Wt 270 lb (122.5 kg)   BMI 36.62 kg/m   Specialty Comments:  No specialty comments available.  PMFS History: Patient Active Problem List   Diagnosis Date Noted  . Idiopathic chronic venous hypertension of both lower extremities with inflammation 07/27/2017  . Amputated great toe, left (HCC) 05/29/2017  . Cellulitis in diabetic foot (HCC) 05/05/2017  . Osteomyelitis (HCC)  05/05/2017  . Newly diagnosed diabetes (HCC) 05/05/2017  . Hyponatremia 05/05/2017  . Diabetic foot ulcer (HCC) 05/04/2017   Past Medical History:  Diagnosis Date  . 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 AMPUTATION Left 05/06/2017   Procedure: AMPUTATION GREAT TOE;  Surgeon: Beverely Low, MD;  Location: [facility];  Service: Orthopedics;  Laterality: Left;  . AMPUTATION Left 05/08/2017   Procedure: Left 1st Ray Amputation;  Surgeon: Nadara Mustard, MD;  Location: [facility] OR;  Service: Orthopedics;  Laterality: Left;  . HAND SURGERY Left   . TONSILLECTOMY     Social History   Occupational History  . Not on file  Tobacco Use  . Smoking status: Former Games developer  . Smokeless tobacco: Never Used  Substance and Sexual Activity  . Alcohol use: No  . Drug use: No  . Sexual activity: Not on file

## 2018-02-05 ENCOUNTER — Telehealth (INDEPENDENT_AMBULATORY_CARE_PROVIDER_SITE_OTHER): Payer: Self-pay | Admitting: Orthopedic Surgery

## 2018-02-05 NOTE — Telephone Encounter (Signed)
Russell Merritt  [phone] [facility]    DOS requesting 01/11/18   Requesting pt medical records  Pt capability form will need to be completed   Information was sent to the office per Russell Merritt

## 2018-02-05 NOTE — Telephone Encounter (Signed)
Will be processed accordingly upon receipt

## 2018-02-08 ENCOUNTER — Encounter (INDEPENDENT_AMBULATORY_CARE_PROVIDER_SITE_OTHER): Payer: Self-pay | Admitting: Orthopedic Surgery

## 2018-02-08 ENCOUNTER — Ambulatory Visit (INDEPENDENT_AMBULATORY_CARE_PROVIDER_SITE_OTHER): Payer: 59 | Admitting: Orthopedic Surgery

## 2018-02-08 VITALS — Ht 72.0 in | Wt 270.0 lb

## 2018-02-08 DIAGNOSIS — S98112A Complete traumatic amputation of left great toe, initial encounter: Secondary | ICD-10-CM

## 2018-02-08 DIAGNOSIS — Z89412 Acquired absence of left great toe: Secondary | ICD-10-CM

## 2018-02-08 NOTE — Progress Notes (Signed)
Office Visit Note   Patient: Russell Merritt           Date of Birth: 06-15-1966           MRN: [id number] Visit Date: 02/08/2018              Requested by: Monica Bectonhekkekandam, Thomas J, MD [address] [place], [place] [address] PCP: Monica Bectonhekkekandam, Thomas J, MD  Chief Complaint  Patient presents with  . Left Foot - Follow-up    Left 1st ray amputation 05/2017      HPI: Patient is a 52 year old gentleman status post left foot first ray amputation.  Patient is wearing compression stockings and states that his leg and foot feel better but he still has pain with weightbearing.  Patient states the swelling has gone down and he can now see the veins in his foot.  Patient states that he cannot return to work at this time due to pain with weightbearing.  Patient states that he did not take the paperwork to his primary care physician for the authorization of his custom orthotics and space filler for his ray amputation.  Assessment & Plan: Visit Diagnoses:  1. Amputated great toe, left (HCC)     Plan: Recommended patient follow-up with his primary care physician to complete the paperwork for orthotics for his left foot status post first ray amputation.  Recommended continue with the compression stockings follow-up in 4 weeks.  Anticipate patient can return to work when he does not have pain with weightbearing.  Follow-Up Instructions: Return in about 4 weeks (around 03/08/2018).   Ortho Exam  Patient is alert, oriented, no adenopathy, well-dressed, normal affect, normal respiratory effort. Examination patient has a good dorsalis pedis pulse he does have brawny skin color changes in the foot and ankle but no open ulcers.  The surgical incision is well-healed there is no redness no cellulitis no drainage no signs of infection there is no tenderness to palpation he has dorsiflexion to neutral.  Discussed the importance of wearing a stiff soled sneaker instead of his flexible  sneakers.  Imaging: No results found. No images are attached to the encounter.  Labs: Lab Results  Component Value Date   HGBA1C 13.4 (H) 05/07/2017   HGBA1C 13.9 (H) 05/06/2017   ESRSEDRATE 118 (H) 05/04/2017   REPTSTATUS 05/10/2017 FINAL 05/06/2017   REPTSTATUS 05/09/2017 FINAL 05/06/2017   GRAMSTAIN  05/06/2017    RARE WBC PRESENT, PREDOMINANTLY PMN FEW GRAM POSITIVE COCCI IN PAIRS RARE GRAM NEGATIVE COCCOBACILLI    CULT  05/06/2017    ABUNDANT PREVOTELLA BIVIA BETA LACTAMASE NEGATIVE Performed at [facility], [address] [place], [place] [address]    CULT  05/06/2017    FEW GROUP B STREP(S.AGALACTIAE)ISOLATED TESTING AGAINST S. AGALACTIAE NOT ROUTINELY PERFORMED DUE TO PREDICTABILITY OF AMP/PEN/VAN SUSCEPTIBILITY. Performed at [facility], [address] [place], [place] [address]    LABORGA STAPHYLOCOCCUS AUREUS 05/05/2017    @LABSALLVALUES (HGBA1)@  Body mass index is 36.62 kg/m.  Orders:  No orders of the defined types were placed in this encounter.  No orders of the defined types were placed in this encounter.    Procedures: No procedures performed  Clinical Data: No additional findings.  ROS:  All other systems negative, except as noted in the HPI. Review of Systems  Objective: Vital Signs: Ht 6' (1.829 m)   Wt 270 lb (122.5 kg)   BMI 36.62 kg/m   Specialty Comments:  No specialty  comments available.  PMFS History: Patient Active Problem List   Diagnosis Date Noted  . Idiopathic chronic venous hypertension of both lower extremities with inflammation 07/27/2017  . Amputated great toe, left (HCC) 05/29/2017  . Cellulitis in diabetic foot (HCC) 05/05/2017  . Osteomyelitis (HCC) 05/05/2017  . Newly diagnosed diabetes (HCC) 05/05/2017  . Hyponatremia 05/05/2017  . Diabetic foot ulcer (HCC) 05/04/2017   Past Medical History:  Diagnosis Date  . 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 AMPUTATION Left 05/06/2017   Procedure: AMPUTATION GREAT TOE;  Surgeon: Beverely Low, MD;  Location: [facility];  Service: Orthopedics;  Laterality: Left;  . AMPUTATION Left 05/08/2017   Procedure: Left 1st Ray Amputation;  Surgeon: Nadara Mustard, MD;  [facility];  Service: Orthopedics;  Laterality: Left;  . HAND SURGERY Left   . TONSILLECTOMY     Social History   Occupational History  . Not on file  Tobacco Use  . Smoking status: Former Games developer  . Smokeless tobacco: Never Used  Substance and Sexual Activity  . Alcohol use: No  . Drug use: No  . Sexual activity: Not on file

## 2018-03-01 ENCOUNTER — Ambulatory Visit: Payer: 59 | Admitting: Physician Assistant

## 2018-03-08 ENCOUNTER — Ambulatory Visit (INDEPENDENT_AMBULATORY_CARE_PROVIDER_SITE_OTHER): Payer: 59 | Admitting: Physician Assistant

## 2018-03-08 ENCOUNTER — Ambulatory Visit (INDEPENDENT_AMBULATORY_CARE_PROVIDER_SITE_OTHER): Payer: 59

## 2018-03-08 ENCOUNTER — Encounter: Payer: Self-pay | Admitting: Physician Assistant

## 2018-03-08 VITALS — BP 162/79 | HR 84 | Wt 299.0 lb

## 2018-03-08 DIAGNOSIS — Z6841 Body Mass Index (BMI) 40.0 and over, adult: Secondary | ICD-10-CM

## 2018-03-08 DIAGNOSIS — E781 Pure hyperglyceridemia: Secondary | ICD-10-CM

## 2018-03-08 DIAGNOSIS — I10 Essential (primary) hypertension: Secondary | ICD-10-CM

## 2018-03-08 DIAGNOSIS — Z0100 Encounter for examination of eyes and vision without abnormal findings: Secondary | ICD-10-CM

## 2018-03-08 DIAGNOSIS — E1169 Type 2 diabetes mellitus with other specified complication: Secondary | ICD-10-CM

## 2018-03-08 DIAGNOSIS — E1159 Type 2 diabetes mellitus with other circulatory complications: Secondary | ICD-10-CM

## 2018-03-08 DIAGNOSIS — G8929 Other chronic pain: Secondary | ICD-10-CM

## 2018-03-08 DIAGNOSIS — E66813 Obesity, class 3: Secondary | ICD-10-CM

## 2018-03-08 DIAGNOSIS — M25511 Pain in right shoulder: Secondary | ICD-10-CM

## 2018-03-08 DIAGNOSIS — E119 Type 2 diabetes mellitus without complications: Secondary | ICD-10-CM

## 2018-03-08 DIAGNOSIS — F17290 Nicotine dependence, other tobacco product, uncomplicated: Secondary | ICD-10-CM

## 2018-03-08 DIAGNOSIS — Z7689 Persons encountering health services in other specified circumstances: Secondary | ICD-10-CM

## 2018-03-08 DIAGNOSIS — E1165 Type 2 diabetes mellitus with hyperglycemia: Secondary | ICD-10-CM

## 2018-03-08 DIAGNOSIS — L299 Pruritus, unspecified: Secondary | ICD-10-CM

## 2018-03-08 DIAGNOSIS — B351 Tinea unguium: Secondary | ICD-10-CM

## 2018-03-08 DIAGNOSIS — Z1389 Encounter for screening for other disorder: Secondary | ICD-10-CM

## 2018-03-08 DIAGNOSIS — Z130 Encounter for screening for diseases of the blood and blood-forming organs and certain disorders involving the immune mechanism: Secondary | ICD-10-CM | POA: Diagnosis not present

## 2018-03-08 DIAGNOSIS — Z13220 Encounter for screening for lipoid disorders: Secondary | ICD-10-CM | POA: Diagnosis not present

## 2018-03-08 DIAGNOSIS — R823 Hemoglobinuria: Secondary | ICD-10-CM

## 2018-03-08 DIAGNOSIS — R7401 Elevation of levels of liver transaminase levels: Secondary | ICD-10-CM

## 2018-03-08 DIAGNOSIS — I878 Other specified disorders of veins: Secondary | ICD-10-CM

## 2018-03-08 DIAGNOSIS — R740 Nonspecific elevation of levels of transaminase and lactic acid dehydrogenase [LDH]: Secondary | ICD-10-CM

## 2018-03-08 DIAGNOSIS — E118 Type 2 diabetes mellitus with unspecified complications: Secondary | ICD-10-CM | POA: Diagnosis not present

## 2018-03-08 DIAGNOSIS — Z1331 Encounter for screening for depression: Secondary | ICD-10-CM

## 2018-03-08 DIAGNOSIS — E1142 Type 2 diabetes mellitus with diabetic polyneuropathy: Secondary | ICD-10-CM

## 2018-03-08 DIAGNOSIS — IMO0002 Reserved for concepts with insufficient information to code with codable children: Secondary | ICD-10-CM

## 2018-03-08 LAB — POCT GLYCOSYLATED HEMOGLOBIN (HGB A1C): HEMOGLOBIN A1C: 7.6

## 2018-03-08 MED ORDER — EMPAGLIFLOZIN-METFORMIN HCL ER 25-1000 MG PO TB24: 1.0000 | ORAL_TABLET | Freq: Every day | ORAL | 1 refills | Status: AC

## 2018-03-08 MED ORDER — ASPIRIN EC 81 MG PO TBEC: 81.0000 mg | DELAYED_RELEASE_TABLET | Freq: Every day | ORAL | 3 refills | Status: AC

## 2018-03-08 MED ORDER — GLIPIZIDE 10 MG PO TABS: 10.0000 mg | ORAL_TABLET | Freq: Every day | ORAL | 1 refills | Status: AC

## 2018-03-08 MED ORDER — BLOOD GLUCOSE MONITOR KIT: PACK | 0 refills | Status: AC

## 2018-03-08 MED ORDER — LISINOPRIL 20 MG PO TABS: 20.0000 mg | ORAL_TABLET | Freq: Every day | ORAL | 5 refills | Status: AC

## 2018-03-08 NOTE — Patient Instructions (Addendum)
For your diabetes: - continue Synjardy and Glipizide as you are taking them - start Lisinopril 20 mg daily - meet with diabetes educator to build custom diabetes nutrition plan. Weight loss will likely get you to a goal A1C of <7.0 rather than adding medication.  Diabetes Preventive Care: - annual foot exam  - annual dilated eye exam with an eye doctor - self foot exams at least weekly - twice yearly dental cleanings and yearly exam - goal blood pressure <140/90, ideally <130/80 - LDL cholesterol <70 - A1C <1.3<7.0 - body mass index (BMI) <30.0 - pneumonia vaccine recommended once and booster in 5 years - annual influenza vaccine recommended annually - ACE inhibitor is recommended to prevent diabetic kidney disease (nephropathy) - Atorvastatin to reduce risk of heart attack and stroke - follow-up every 3 months if your A1C is not at goal  For your blood pressure: - Goal <130/80 - baby aspirin 81 mg daily to help prevent heart attack/stroke - monitor and log blood pressures at home - check around the same time each day in a relaxed setting - Limit salt to <2000 mg/day - Follow DASH eating plan - limit alcohol to 2 standard drinks per day for men and 1 per day for women - avoid tobacco products - weight loss: 7% of current body weight - follow-up every 6 months for your blood pressure      Itching Without Rash Just to review, here are some of the more common causes of itching without rash covered in this guide:  - Allergic reactions  - Dry skin  - Iron deficiency anemia  - Kidney disease  - Liver disease  - Pregnancy  - Psychological distress  - Thyroid disease  - Water-induced itching (aquagenic pruritus)  If the cause of your itching was not covered here or if your itching doesn't respond to simple treatments (such as moisturizers), be sure to see your doctor for evaluation.  Remember:  - There are many different causes of itching without rash  - Most people  with itching and no rash have a condition that is readily diagnosed and easily treated; however, for some, the cause may remain mysterious  - If the cause of your itching remains uncertain after initial evaluation, some simple tests may be helpful, including a blood count, liver and kidney tests, thyroid screening, chest x-ray, stool testing for blood, and urine analysis; depending on other symptoms and the results of initial evaluation, additional testing may be recommended  - It may be helpful to see a dermatologist if you have persistent itching without an explanation  We hope you feel better soon!

## 2018-03-08 NOTE — Progress Notes (Signed)
HPI:                                                                Russell Merritt is a 52 y.o. male who presents to [facility] today to establish care  Current concerns: itching/pain  DM 2: He was started on Synjardy and Glipizide 05/11/17. Compliant with medications. Last A1C 13.4, 05/11/17. Lost his glucometer in a move, but prior to that was getting FBG's in the 110's-130's. Denies hypoglycemic events. Denies polydipsia, polyuria, polyphagia. Denies blurred vision or vision change. He has pain in the area of his amputation/surgical scar as well as decreased sensation in his left forefoot. Denies ulcers/wounds on feet. Eye exam: due Foot exam: s/p amputation L great toe followed by Ortho Diet: fair Exercise: sedentary  He is requesting completion of form for custom orthopedic shoes today.  Pruritus: patient reports for over 1 year he has had persistent itching in one specific location on the right side of his upper back /perscapular area. The itching has been progressively worsening and recently was associated with pain. Patient reports pain was not reproducible when he rubbed his back against a doorframe. Patient reports no rashes or lesions. No skin discoloration. The pain is what concerned him most.   Depression screen [facility] 2/9 03/08/2018  Decreased Interest 2  Down, Depressed, Hopeless 1  PHQ - 2 Score 3  Altered sleeping 1  Tired, decreased energy 1  Change in appetite 0  Feeling bad or failure about yourself  1  Trouble concentrating 1  Moving slowly or fidgety/restless 0  Suicidal thoughts 0  PHQ-9 Score 7    GAD 7 : Generalized Anxiety Score 03/08/2018  Nervous, Anxious, on Edge 1  Control/stop worrying 2  Worry too much - different things 2  Trouble relaxing 1  Restless 1  Easily annoyed or irritable 1  Afraid - awful might happen 2  Total GAD 7 Score 10      Past Medical History:  Diagnosis Date  . Diabetic  ulcer of toe of left foot associated with type 2 diabetes mellitus ([place]) 07/27/2017   Past Surgical History:  Procedure Laterality Date  . AMPUTATION Left 05/06/2017   Procedure: AMPUTATION GREAT TOE;  Surgeon: Netta Cedars, MD;  Location: [facility];  Service: Orthopedics;  Laterality: Left;  . AMPUTATION Left 05/08/2017   Procedure: Left 1st Ray Amputation;  Surgeon: Newt Minion, MD;  Location: [place];  Service: Orthopedics;  Laterality: Left;  . HAND SURGERY Left   . TONSILLECTOMY     Social History   Tobacco Use  . Smoking status: Former Research scientist (life sciences)  . Smokeless tobacco: Never Used  Substance Use Topics  . Alcohol use: No   Family history is unknown by patient.    ROS: negative except as noted in the HPI  Medications: Current Outpatient Medications  Medication Sig Dispense Refill  . acetaminophen (TYLENOL) 325 MG tablet Take 2 tablets (650 mg total) by mouth every 6 (six) hours as needed for mild pain (or Fever >/= 101).    Marland Kitchen aspirin EC 81 MG tablet Take 1 tablet (81 mg total) by mouth daily. 90 tablet 3  . blood glucose meter kit and supplies KIT Check morning fasting blood sugar  daily and up to four times daily as directed 1 each 0  . Empagliflozin-metFORMIN HCl ER (SYNJARDY XR) 25-1000 MG TB24 Take 1 tablet by mouth daily. 90 tablet 1  . glipiZIDE (GLUCOTROL) 10 MG tablet Take 1 tablet (10 mg total) by mouth daily before breakfast. 90 tablet 1  . ibuprofen (ADVIL,MOTRIN) 200 MG tablet Take 800 mg by mouth every 8 (eight) hours as needed for moderate pain.     Marland Kitchen lisinopril (PRINIVIL,ZESTRIL) 20 MG tablet Take 1 tablet (20 mg total) by mouth daily. 30 tablet 5  . MULTIPLE VITAMIN PO Take 1 tablet by mouth daily.      No current facility-administered medications for this visit.    Allergies  Allergen Reactions  . No Known Allergies        Objective:  BP (!) 162/79   Pulse 84   Wt 299 lb (135.6 kg)   BMI 40.55 kg/m  Gen:  alert, not ill-appearing, no distress, appropriate  for age, morbidly obese male HEENT: head normocephalic without obvious abnormality, conjunctiva and cornea clear, wearing glasses, trachea midline Pulm: Normal work of breathing, normal phonation, clear to auscultation bilaterally, no wheezes, rales or rhonchi CV: Normal rate, regular rhythm, s1 and s2 distinct, no murmurs, clicks or rubs  Neuro: alert and oriented x 3, no tremor MSK: back atraumatic, no abnormal curvature, trace peripheral edema, left great toe absent Skin: anterior distal lower extremities with venous stasis, onychomycosis of all digits, no rashes or suspicious lesions on the trunk  Psych: well-groomed, cooperative, good eye contact, euthymic mood, affect mood-congruent, speech is articulate, and thought processes clear and goal-directed  Diabetic Foot Exam - Simple   Simple Foot Form Diabetic Foot exam was performed with the following findings:  Yes 03/08/2018 11:59 AM  Visual Inspection See comments:  Yes Sensation Testing See comments:  Yes Pulse Check See comments:  Yes Comments Left great toe amputated. Well-healed surgical incision.  Left sensory deficits to monofilament at 10, 2, 4, 5 Bilateral onychomycosis Skin is dry with mild breakdown of the skin in the interphalangeal areas PT pulses are faint DP pulses 1+       Results for orders placed or performed in visit on 03/08/18 (from the past 72 hour(s))  POCT HgB A1C     Status: None   Collection Time: 03/08/18 11:42 AM  Result Value Ref Range   Hemoglobin A1C 7.6    No results found.    Assessment and Plan: 52 y.o. male with   Personally reviewed PMH, PSH, PFH, medications, allergies, HM Due for colon cancer screening  Due for all diabetes preventive care Declines age-recommended immunizations Positive PHQ2, no acute safety issues, due to complexity of patient's conditions and other concerns there was not adequate time to address at this visit. Will address at future visits. A1C is nearly at  goal. Plan to continue Synjardy and Glipizide and refer to diabetes educator to help with nutrition and weight loss. I think this can get him to goal if he is aggressive about it, but if not at goal in 3 months, I will add a GLP-1 Starting ACE for HTN today. Counseled on therapeutic lifestyle changes. Return in 2 weeks for nurse BP check.  CMP and UA pending. Will dose statin pending fasting lipid panel  Referred for diabetic eye exam Counseled on weight loss through decreased caloric intake and increased aerobic exercise. Referred to nutritionist  Encounter to establish care  Uncontrolled type 2 diabetes mellitus with complication, without long-term  current use of insulin ([place]) - Plan: POCT HgB A1C, Ambulatory referral to diabetic education, blood glucose meter kit and supplies KIT, aspirin EC 81 MG tablet, Empagliflozin-metFORMIN HCl ER (SYNJARDY XR) 25-1000 MG TB24, glipiZIDE (GLUCOTROL) 10 MG tablet, Ambulatory referral to Ophthalmology  Pruritus - Plan: COMPLETE METABOLIC PANEL WITH GFR, CBC, TSH + free T4  Diabetic peripheral neuropathy associated with type 2 diabetes mellitus ([place])  Onychomycosis of multiple toenails with type 2 diabetes mellitus ([place]) - Plan: Ambulatory referral to Podiatry  Hypertension associated with diabetes ([place]) - Plan: lisinopril (PRINIVIL,ZESTRIL) 20 MG tablet, Urinalysis, Routine w reflex microscopic  Screening for lipid disorders - Plan: Lipid Panel w/reflex Direct LDL  Screening for blood or protein in urine - Plan: Urinalysis, Routine w reflex microscopic  Screening for blood disease - Plan: COMPLETE METABOLIC PANEL WITH GFR, CBC  Chronic periscapular pain on right side - Plan: DG Chest 2 View, DG Cervical Spine Complete  Diabetic eye exam ([place]) - referral to [facility] 03/08/18 - Plan: Ambulatory referral to Ophthalmology  Class 3 severe obesity due to excess calories without serious comorbidity with body mass index (BMI) of 40.0 to 44.9 in adult  [facility])  Positive depression screening  Other tobacco product nicotine dependence, uncomplicated   Patient education and anticipatory guidance given Patient agrees with treatment plan Follow-up in 3 months for medication management or sooner as needed if symptoms worsen or fail to improve  Darlyne Russian PA-C

## 2018-03-09 ENCOUNTER — Encounter (INDEPENDENT_AMBULATORY_CARE_PROVIDER_SITE_OTHER): Payer: Self-pay | Admitting: Orthopedic Surgery

## 2018-03-09 ENCOUNTER — Ambulatory Visit (INDEPENDENT_AMBULATORY_CARE_PROVIDER_SITE_OTHER): Payer: 59 | Admitting: Orthopedic Surgery

## 2018-03-09 ENCOUNTER — Encounter: Payer: Self-pay | Admitting: Physician Assistant

## 2018-03-09 VITALS — Ht 72.0 in | Wt 299.0 lb

## 2018-03-09 DIAGNOSIS — E1159 Type 2 diabetes mellitus with other circulatory complications: Secondary | ICD-10-CM | POA: Insufficient documentation

## 2018-03-09 DIAGNOSIS — E1169 Type 2 diabetes mellitus with other specified complication: Secondary | ICD-10-CM

## 2018-03-09 DIAGNOSIS — B351 Tinea unguium: Secondary | ICD-10-CM | POA: Insufficient documentation

## 2018-03-09 DIAGNOSIS — M25511 Pain in right shoulder: Secondary | ICD-10-CM

## 2018-03-09 DIAGNOSIS — M6702 Short Achilles tendon (acquired), left ankle: Secondary | ICD-10-CM | POA: Diagnosis not present

## 2018-03-09 DIAGNOSIS — E119 Type 2 diabetes mellitus without complications: Secondary | ICD-10-CM | POA: Insufficient documentation

## 2018-03-09 DIAGNOSIS — I878 Other specified disorders of veins: Secondary | ICD-10-CM | POA: Insufficient documentation

## 2018-03-09 DIAGNOSIS — F17200 Nicotine dependence, unspecified, uncomplicated: Secondary | ICD-10-CM | POA: Insufficient documentation

## 2018-03-09 DIAGNOSIS — L299 Pruritus, unspecified: Secondary | ICD-10-CM | POA: Insufficient documentation

## 2018-03-09 DIAGNOSIS — Z6841 Body Mass Index (BMI) 40.0 and over, adult: Secondary | ICD-10-CM

## 2018-03-09 DIAGNOSIS — I10 Essential (primary) hypertension: Secondary | ICD-10-CM

## 2018-03-09 DIAGNOSIS — I87323 Chronic venous hypertension (idiopathic) with inflammation of bilateral lower extremity: Secondary | ICD-10-CM

## 2018-03-09 DIAGNOSIS — G8929 Other chronic pain: Secondary | ICD-10-CM | POA: Insufficient documentation

## 2018-03-09 DIAGNOSIS — E1142 Type 2 diabetes mellitus with diabetic polyneuropathy: Secondary | ICD-10-CM | POA: Insufficient documentation

## 2018-03-09 DIAGNOSIS — Z0100 Encounter for examination of eyes and vision without abnormal findings: Secondary | ICD-10-CM

## 2018-03-09 DIAGNOSIS — S98112A Complete traumatic amputation of left great toe, initial encounter: Secondary | ICD-10-CM

## 2018-03-09 DIAGNOSIS — Z1331 Encounter for screening for depression: Secondary | ICD-10-CM | POA: Insufficient documentation

## 2018-03-09 LAB — COMPLETE METABOLIC PANEL WITH GFR
AG Ratio: 1.1 (calc) (ref 1.0–2.5)
ALKALINE PHOSPHATASE (APISO): 62 U/L (ref 40–115)
ALT: 56 U/L — AB (ref 9–46)
AST: 63 U/L — ABNORMAL HIGH (ref 10–35)
Albumin: 3.9 g/dL (ref 3.6–5.1)
BILIRUBIN TOTAL: 0.5 mg/dL (ref 0.2–1.2)
BUN: 16 mg/dL (ref 7–25)
CO2: 27 mmol/L (ref 20–32)
Calcium: 9.4 mg/dL (ref 8.6–10.3)
Chloride: 101 mmol/L (ref 98–110)
Creat: 0.82 mg/dL (ref 0.70–1.33)
GFR, Est African American: 119 mL/min/{1.73_m2} (ref >=60)
GFR, Est Non African American: 102 mL/min/{1.73_m2} (ref >=60)
GLUCOSE: 122 mg/dL — AB (ref 65–99)
Globulin: 3.5 g/dL (calc) (ref 1.9–3.7)
Potassium: 4.2 mmol/L (ref 3.5–5.3)
Sodium: 137 mmol/L (ref 135–146)
TOTAL PROTEIN: 7.4 g/dL (ref 6.1–8.1)

## 2018-03-09 LAB — LIPID PANEL W/REFLEX DIRECT LDL
CHOLESTEROL: 355 mg/dL — AB (ref <200)
HDL: 29 mg/dL — ABNORMAL LOW (ref >40)
Non-HDL Cholesterol (Calc): 326 mg/dL (calc) — ABNORMAL HIGH (ref <130)
TRIGLYCERIDES: 1626 mg/dL — AB (ref <150)
Total CHOL/HDL Ratio: 12.2 (calc) — ABNORMAL HIGH (ref <5.0)

## 2018-03-09 LAB — URINALYSIS, ROUTINE W REFLEX MICROSCOPIC
BACTERIA UA: NONE SEEN /HPF
Bilirubin Urine: NEGATIVE
HYALINE CAST: NONE SEEN /LPF
Ketones, ur: NEGATIVE
Leukocytes, UA: NEGATIVE
Nitrite: NEGATIVE
SQUAMOUS EPITHELIAL / LPF: NONE SEEN /HPF (ref <=5)
Specific Gravity, Urine: 1.037 — ABNORMAL HIGH (ref 1.001–1.03)
WBC, UA: NONE SEEN /HPF (ref 0–5)
pH: 5.5 (ref 5.0–8.0)

## 2018-03-09 LAB — CBC
HCT: 47.2 % (ref 38.5–50.0)
HEMOGLOBIN: 16.8 g/dL (ref 13.2–17.1)
MCH: 30.9 pg (ref 27.0–33.0)
MCHC: 35.6 g/dL (ref 32.0–36.0)
MCV: 86.8 fL (ref 80.0–100.0)
MPV: 11.1 fL (ref 7.5–12.5)
PLATELETS: 227 10*3/uL (ref 140–400)
RBC: 5.44 10*6/uL (ref 4.20–5.80)
RDW: 13.2 % (ref 11.0–15.0)
WBC: 7.8 10*3/uL (ref 3.8–10.8)

## 2018-03-09 LAB — DIRECT LDL

## 2018-03-09 LAB — TSH+FREE T4: TSH W/REFLEX TO FT4: 1.79 mIU/L (ref 0.40–4.50)

## 2018-03-09 NOTE — Progress Notes (Signed)
Office Visit Note   Patient: Russell Merritt           Date of Birth: 1966/05/05           MRN: [id number] Visit Date: 03/09/2018              Requested by: Monica Becton, MD [address] [place], [place] [address] PCP: Carlis Stable, [place]  Chief Complaint  Patient presents with  . 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  Patient complains of pain beneath the metatarsal heads on the left foot.  He has not obtained his custom orthotic spacer from [employer] yet.  He is currently full weightbearing in regular shoes wearing his medical compression socks.  Assessment & Plan: Visit Diagnoses:  1. Amputated great toe, left (HCC)     Plan: Patient was given instruction and demonstrated Achilles stretching to do 5 times a day a minute at a time he will follow-up with [employer] for his custom orthotics and spacer.  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  Once patient has stretched out his Achilles and has his custom orthotics he should be able to return to work.  Follow-Up Instructions: Return in about 1 month (around 04/06/2018).   Ortho Exam  Patient is alert, oriented, no adenopathy, well-dressed, normal affect, normal respiratory effort. Examination patient has venous insufficiency with brawny skin color changes but no ulcers.  He has a Achilles contracture on the left with dorsiflexion about 10 degrees short of neutral with his knee extended he has good pulses the first ray amputation is healed well.  There are no ulcers beneath the toes or metatarsal heads but palpation beneath the metatarsal heads.  Imaging: No results found. No images are attached to the encounter.  Labs: Lab Results  Component Value Date   HGBA1C 7.6 03/08/2018   HGBA1C 13.4 (H) 05/07/2017   HGBA1C 13.9 (H) 05/06/2017   ESRSEDRATE 118 (H) 05/04/2017   REPTSTATUS 05/10/2017 FINAL 05/06/2017   REPTSTATUS 05/09/2017 FINAL 05/06/2017   GRAMSTAIN  05/06/2017    RARE WBC PRESENT, PREDOMINANTLY PMN FEW GRAM POSITIVE COCCI IN PAIRS RARE GRAM NEGATIVE COCCOBACILLI    CULT  05/06/2017    ABUNDANT PREVOTELLA BIVIA BETA LACTAMASE NEGATIVE Performed at [facility], [address], [place], [place] [address]    CULT  05/06/2017    FEW GROUP B STREP(S.AGALACTIAE)ISOLATED TESTING AGAINST S. AGALACTIAE NOT ROUTINELY PERFORMED DUE TO PREDICTABILITY OF AMP/PEN/VAN SUSCEPTIBILITY. Performed at [facility], [address], [place], [place] [address]    LABORGA STAPHYLOCOCCUS AUREUS 05/05/2017    @LABSALLVALUES (HGBA1)@  Body mass index is 40.55 kg/m.  Orders:  No orders of the defined types were placed in this encounter.  No orders of the defined types were placed in this encounter.    Procedures: No procedures performed  Clinical Data: No additional findings.  ROS:  All other systems negative, except as noted in the HPI. Review of Systems  Objective: Vital Signs: Ht 6' (1.829 m)   Wt 299 lb (135.6 kg)   BMI 40.55 kg/m   Specialty Comments:  No specialty comments available.  PMFS History: Patient Active  Problem List   Diagnosis Date Noted  . Pruritus 03/09/2018  . Diabetic peripheral neuropathy associated with type 2 diabetes mellitus (HCC) 03/09/2018  . Onychomycosis of multiple toenails with type 2 diabetes mellitus (HCC) 03/09/2018  . Hypertension associated with diabetes (HCC) 03/09/2018  . Chronic periscapular pain on right side 03/09/2018  . Diabetic eye exam (HCC) 03/09/2018  . Class 3 severe obesity due to excess calories without serious comorbidity with body mass index (BMI) of 40.0 to 44.9 in adult (HCC) 03/09/2018  . Positive depression screening 03/09/2018  . Nicotine dependence  03/09/2018  . Venous stasis 03/09/2018  . Idiopathic chronic venous hypertension of both lower extremities with inflammation 07/27/2017  . Amputated great toe, left (HCC) 05/29/2017  . Cellulitis in diabetic foot (HCC) 05/05/2017  . Osteomyelitis (HCC) 05/05/2017  . Newly diagnosed diabetes (HCC) 05/05/2017  . Hyponatremia 05/05/2017  . Uncontrolled type 2 diabetes mellitus with complication, without long-term current use of insulin (HCC)   . Diabetic foot ulcer (HCC) 05/04/2017   Past Medical History:  Diagnosis Date  . 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 Hypertension   . Neuromuscular disorder (HCC)     Family History  Problem Relation Age of Onset  . Diabetes Mother   . Heart attack Father   . Diabetes Maternal Grandmother     Past Surgical History:  Procedure Laterality Date  . AMPUTATION Left 05/06/2017   Procedure: AMPUTATION GREAT TOE;  Surgeon: Beverely LowNorris, Steve, MD;  Location: [facility];  Service: Orthopedics;  Laterality: Left;  . AMPUTATION Left 05/08/2017   Procedure: Left 1st Ray Amputation;  Surgeon: Nadara Mustarduda, Marcus V, MD;  [facility] OR;  Service: Orthopedics;  Laterality: Left;  . HAND SURGERY Left   . TONSILLECTOMY     Social History   Occupational History  . Not on file  Tobacco Use  . Smoking status: Former Smoker    Types: Cigarettes    Last attempt to quit: 03/28/2013    Years since quitting: 4.9  . Smokeless tobacco: Never Used  Substance and Sexual Activity  . Alcohol use: No  . Drug use: No  . Sexual activity: Not Currently

## 2018-03-10 MED ORDER — OMEGA-3-ACID ETHYL ESTERS 1 G PO CAPS: 2.0000 g | ORAL_CAPSULE | Freq: Two times a day (BID) | ORAL | 3 refills | Status: AC

## 2018-03-10 NOTE — Addendum Note (Signed)
Addended by: Gena FrayUMMINGS, CHARLEY E on: 03/10/2018 08:01 AM   Modules accepted: Orders

## 2018-03-10 NOTE — Progress Notes (Signed)
Normal neck x-ray  Normal chest x-ray

## 2018-03-10 NOTE — Progress Notes (Signed)
-   liver enzymes are elevated, this is likely due to fatty liver disease. Recommend low-fat diet, avoiding alcohol, aerobic exercise and weight loss. Recheck in 3 months - total cholesterol and triglycerides are extremely high. When they are this high there is a risk of developing pancreatitis. I have sent in a medication called Lovaza to lower triglycerides. He can look online for discount coupon. - recheck fasting cholesterol panel in 1 month

## 2018-03-22 ENCOUNTER — Ambulatory Visit (INDEPENDENT_AMBULATORY_CARE_PROVIDER_SITE_OTHER): Payer: 59 | Admitting: Physician Assistant

## 2018-03-22 VITALS — BP 138/71 | HR 82 | Wt 299.0 lb

## 2018-03-22 DIAGNOSIS — I10 Essential (primary) hypertension: Secondary | ICD-10-CM

## 2018-03-22 DIAGNOSIS — E1159 Type 2 diabetes mellitus with other circulatory complications: Secondary | ICD-10-CM

## 2018-03-22 DIAGNOSIS — R823 Hemoglobinuria: Secondary | ICD-10-CM

## 2018-03-22 DIAGNOSIS — E781 Pure hyperglyceridemia: Secondary | ICD-10-CM

## 2018-03-22 DIAGNOSIS — R740 Nonspecific elevation of levels of transaminase and lactic acid dehydrogenase [LDH]: Secondary | ICD-10-CM | POA: Diagnosis not present

## 2018-03-22 DIAGNOSIS — R7401 Elevation of levels of liver transaminase levels: Secondary | ICD-10-CM

## 2018-03-22 NOTE — Progress Notes (Signed)
   Subjective:    Patient ID: Russell Merritt, male    DOB: June 04, 1966, 52 y.o.   MRN: [id number]  HPI  Russell Merritt is here for a blood pressure check. He has taken the lisinopril today. Denies medication problems, shortness of breath, chest pains, dizziness or headaches.   Review of Systems     Objective:   Physical Exam  Vitals:   03/22/18 1041 03/25/18 1510  BP: (!) 151/69 138/71  Pulse: 82   SpO2: 97%    BP Readings from Last 3 Encounters:  03/25/18 138/71  03/08/18 (!) 162/79  05/11/17 130/78          Assessment & Plan:  HTN - 1st check of blood pressure is elevated, 151/69. Patient rested and 2nd check of blood pressure is better at 138/71. Patient advised to continue current medications and [facility] Also reminded him to have fasting labs in 1 month, around May 8 th.

## 2018-03-25 ENCOUNTER — Encounter: Payer: Self-pay | Admitting: Physician Assistant

## 2018-04-07 ENCOUNTER — Telehealth (INDEPENDENT_AMBULATORY_CARE_PROVIDER_SITE_OTHER): Payer: Self-pay | Admitting: Orthopedic Surgery

## 2018-04-07 NOTE — Telephone Encounter (Signed)
Russell Merritt with [employer] needs clarification on patients restrictions, more specifically is patient able to work a part time sedentary job? -  since on the form it was stated that patient is not able to work in current occupation. Please give Russell Merritt a call back # [phone] ext. [phone]. Can leave a vm

## 2018-04-08 ENCOUNTER — Encounter (INDEPENDENT_AMBULATORY_CARE_PROVIDER_SITE_OTHER): Payer: Self-pay | Admitting: Orthopedic Surgery

## 2018-04-08 ENCOUNTER — Ambulatory Visit (INDEPENDENT_AMBULATORY_CARE_PROVIDER_SITE_OTHER): Payer: 59 | Admitting: Orthopedic Surgery

## 2018-04-08 DIAGNOSIS — S98112A Complete traumatic amputation of left great toe, initial encounter: Secondary | ICD-10-CM

## 2018-04-08 DIAGNOSIS — Z89412 Acquired absence of left great toe: Secondary | ICD-10-CM

## 2018-04-08 DIAGNOSIS — I87323 Chronic venous hypertension (idiopathic) with inflammation of bilateral lower extremity: Secondary | ICD-10-CM

## 2018-04-08 NOTE — Progress Notes (Signed)
Office Visit Note   Patient: Russell Merritt           Date of Birth: Jul 29, 1966           MRN: [id number] Visit Date: 04/08/2018              Requested by: Carlis Stable, PA-C [address] [place], [place] [address] PCP: Carlis Stable, [place]  Chief Complaint  Patient presents with  . Left Foot - Follow-up, Pain      HPI: Patient presents in follow-up he is about 11 months status post left foot first ray amputation.  Patient states that he has tried to mow his lawn and after less than several hours he is unable to ambulate due to pain across the metatarsal heads.  Patient complains of pain from swelling from his venous insufficiency.  Assessment & Plan: Visit Diagnoses:  1. Amputated great toe, left (HCC)   2. Idiopathic chronic venous hypertension of both lower extremities with inflammation     Plan: Recommended a stiff soled athletic sneakers such as [employer].  Recommended sole orthotics.  Patient states he cannot afford the custom orthotics and extra-depth shoes.  Patient states he still unable to work due to pain with ambulation and we will have him stay out of work with follow-up in 3 months.  Follow-Up Instructions: Return in about 3 months (around 07/09/2018).   Ortho Exam  Patient is alert, oriented, no adenopathy, well-dressed, normal affect, normal respiratory effort. Semination patient has a good dorsalis pedis and posterior tibial pulse there is venous stasis changes and swelling in the left lower extremity but there are no ulcers.  Patient ambulates with an antalgic gait.  There are no ulcers or calluses beneath the metatarsal heads the surgical incision is well-healed he has dorsiflexion about 10 degrees past neutral with improved stretching of his Achilles.  Imaging: No results found. No images are attached to the encounter.  Labs: Lab Results  Component Value Date   HGBA1C 7.6 03/08/2018   HGBA1C 13.4 (H) 05/07/2017   HGBA1C 13.9 (H) 05/06/2017   ESRSEDRATE 118 (H) 05/04/2017   REPTSTATUS 05/10/2017 FINAL 05/06/2017   REPTSTATUS 05/09/2017 FINAL 05/06/2017   GRAMSTAIN  05/06/2017    RARE WBC PRESENT, PREDOMINANTLY PMN FEW GRAM POSITIVE COCCI IN PAIRS RARE GRAM NEGATIVE COCCOBACILLI    CULT  05/06/2017    ABUNDANT PREVOTELLA BIVIA BETA LACTAMASE NEGATIVE Performed at [facility], [address] [place], [place] [address]    CULT  05/06/2017    FEW GROUP B STREP(S.AGALACTIAE)ISOLATED TESTING AGAINST S. AGALACTIAE NOT ROUTINELY PERFORMED DUE TO PREDICTABILITY OF AMP/PEN/VAN SUSCEPTIBILITY. Performed at [facility], [address], [place], [place] [address]    LABORGA STAPHYLOCOCCUS AUREUS 05/05/2017     No results found for: ALBUMIN, PREALBUMIN, LABURIC  There is no height or weight on file to calculate BMI.  Orders:  No orders of the defined types were placed in this encounter.  No orders of the defined types were placed in this encounter.    Procedures: No procedures performed  Clinical Data: No additional findings.  ROS:  All other systems negative, except as noted in the HPI. Review of Systems  Objective: Vital Signs: There were no vitals taken for this visit.  Specialty Comments:  No specialty comments available.  PMFS History: Patient Active Problem List   Diagnosis Date Noted  . Pruritus 03/09/2018  . Diabetic peripheral neuropathy associated with type 2 diabetes  mellitus (HCC) 03/09/2018  . Onychomycosis of multiple toenails with type 2 diabetes mellitus (HCC) 03/09/2018  . Hypertension associated with diabetes (HCC) 03/09/2018  . Chronic periscapular pain on right side 03/09/2018  . Diabetic eye exam (HCC) 03/09/2018  . Class 3 severe obesity due to excess calories without serious comorbidity with body mass index (BMI) of 40.0 to 44.9 in adult (HCC) 03/09/2018  . Positive depression screening 03/09/2018  . Nicotine dependence 03/09/2018  . Venous  stasis 03/09/2018  . Idiopathic chronic venous hypertension of both lower extremities with inflammation 07/27/2017  . Amputated great toe, left (HCC) 05/29/2017  . Cellulitis in diabetic foot (HCC) 05/05/2017  . Osteomyelitis (HCC) 05/05/2017  . Newly diagnosed diabetes (HCC) 05/05/2017  . Hyponatremia 05/05/2017  . Uncontrolled type 2 diabetes mellitus with complication, without long-term current use of insulin (HCC)   . Diabetic foot ulcer (HCC) 05/04/2017   Past Medical History:  Diagnosis Date  . 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 Hypertension   . Neuromuscular disorder (HCC)     Family History  Problem Relation Age of Onset  . Diabetes Mother   . Heart attack Father   . Diabetes Maternal Grandmother     Past Surgical History:  Procedure Laterality Date  . AMPUTATION Left 05/06/2017   Procedure: AMPUTATION GREAT TOE;  Surgeon: Beverely Low, MD;  Location: [facility];  Service: Orthopedics;  Laterality: Left;  . AMPUTATION Left 05/08/2017   Procedure: Left 1st Ray Amputation;  Surgeon: Nadara Mustard, MD;  Location[facility] OR;  Service: Orthopedics;  Laterality: Left;  . HAND SURGERY Left   . TONSILLECTOMY     Social History   Occupational History  . Not on file  Tobacco Use  . Smoking status: Former Smoker    Types: Cigarettes    Last attempt to quit: 03/28/2013    Years since quitting: 5.0  . Smokeless tobacco: Never Used  Substance and Sexual Activity  . Alcohol use: No  . Drug use: No  . Sexual activity: Not Currently

## 2018-04-09 NOTE — Telephone Encounter (Signed)
IC Russell Merritt and advised per Dr Audrie Lia note needs to be out of work with follow up in 3 months. They need clarification if he would be cleared for sedentary duty, working part time? Advised would hold until next week since Dr Lajoyce Corners out of office.

## 2018-04-12 NOTE — Telephone Encounter (Signed)
Pt is 11 months s/p a GT amputation. disability is asking if he is able to do sedentary , light duty or part time work. Please see last note and advise.

## 2018-04-13 ENCOUNTER — Telehealth (INDEPENDENT_AMBULATORY_CARE_PROVIDER_SITE_OTHER): Payer: Self-pay | Admitting: Orthopedic Surgery

## 2018-04-13 NOTE — Telephone Encounter (Signed)
Sh[place] from [employer] left a vm message wanting to get the status of his life insurance that was faxed to our office.  I could not really understand what she was saying on the vm.

## 2018-04-14 NOTE — Telephone Encounter (Signed)
no

## 2018-04-15 NOTE — Telephone Encounter (Signed)
Called and lm on vm for Riki Rusk to advise. To cb with questions.

## 2018-04-15 NOTE — Telephone Encounter (Signed)
Duplicate message this has been done.  ?

## 2018-04-30 ENCOUNTER — Telehealth (INDEPENDENT_AMBULATORY_CARE_PROVIDER_SITE_OTHER): Payer: Self-pay | Admitting: Orthopedic Surgery

## 2018-04-30 NOTE — Telephone Encounter (Signed)
Holding for you.  

## 2018-04-30 NOTE — Telephone Encounter (Signed)
[employer] [phone]       Called to check on the status of Medical records

## 2018-05-03 ENCOUNTER — Encounter: Payer: Self-pay | Admitting: Podiatry

## 2018-05-03 ENCOUNTER — Ambulatory Visit (INDEPENDENT_AMBULATORY_CARE_PROVIDER_SITE_OTHER): Payer: 59 | Admitting: Podiatry

## 2018-05-03 VITALS — BP 112/76 | HR 78 | Ht 72.0 in | Wt 299.0 lb

## 2018-05-03 DIAGNOSIS — E0842 Diabetes mellitus due to underlying condition with diabetic polyneuropathy: Secondary | ICD-10-CM

## 2018-05-03 DIAGNOSIS — I87323 Chronic venous hypertension (idiopathic) with inflammation of bilateral lower extremity: Secondary | ICD-10-CM | POA: Diagnosis not present

## 2018-05-03 DIAGNOSIS — S98112A Complete traumatic amputation of left great toe, initial encounter: Secondary | ICD-10-CM | POA: Diagnosis not present

## 2018-05-03 DIAGNOSIS — B351 Tinea unguium: Secondary | ICD-10-CM | POA: Diagnosis not present

## 2018-05-03 NOTE — Telephone Encounter (Signed)
Was there a request for medical records on this pt? See message below.

## 2018-05-03 NOTE — Progress Notes (Signed)
SUBJECTIVE: 52 y.o. year old male presents requesting toe nails trimmed. He was informed by his doctor to have toe nail trimmed by podiatrist.  He was seen last year for infected left great toe with positive sign of osteomyelitis and sent off to ER. Subsequently he under went emergency surgery. Patient is using Compression socks for Venous stasis condition.   HPI: Amputation left first ray following Osteomyelitis on left great toe June 2018. 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 Been having swelling issue that is almost resolved. Still have frequent loss of balance with left foot.   OBJECTIVE: DERMATOLOGIC EXAMINATION: Thick dystrophic nails x 9. Well healed old surgical site left foot at medial aspect. Venous stasis hyperpigmentation of lower limbs.  VASCULAR EXAMINATION OF LOWER LIMBS: All pedal pulses are palpable with normal pulsation.  Capillary Filling times within 3 seconds in all digits.  Minimum edema noted on left foot. Temperature gradient from tibial crest to dorsum of foot is within normal bilateral.  NEUROLOGIC EXAMINATION OF THE LOWER LIMBS: Diagnosed with Diabetic peripheral neuropathy.  MUSCULOSKELETAL EXAMINATION: Non traumatic amputation left first ray 05/2017.  ASSESSMENT: Onychomycosis x 9. S/P None traumatic amputation first ray left foot. Frequent loss of balance. Diabetic peripheral neuropathy.  PLAN: Reviewed findings. Continue with compression socks. Continue with daily strengthening exercise as prescribed by his surgeon.  All nails debrided. Return in 3 month.

## 2018-05-03 NOTE — Telephone Encounter (Signed)
This was a form [facility]

## 2018-05-03 NOTE — Patient Instructions (Signed)
Seen for hypertrophic nails. All nails debrided. Return in 3 months or as needed.  

## 2018-06-10 ENCOUNTER — Ambulatory Visit: Payer: 59 | Admitting: Physician Assistant

## 2018-06-21 ENCOUNTER — Ambulatory Visit: Payer: 59 | Admitting: Physician Assistant

## 2018-07-08 ENCOUNTER — Ambulatory Visit (INDEPENDENT_AMBULATORY_CARE_PROVIDER_SITE_OTHER): Payer: 59 | Admitting: Orthopedic Surgery

## 2018-08-03 ENCOUNTER — Ambulatory Visit (INDEPENDENT_AMBULATORY_CARE_PROVIDER_SITE_OTHER): Payer: Self-pay | Admitting: Orthopedic Surgery

## 2018-08-03 ENCOUNTER — Encounter (INDEPENDENT_AMBULATORY_CARE_PROVIDER_SITE_OTHER): Payer: Self-pay | Admitting: Orthopedic Surgery

## 2018-08-03 DIAGNOSIS — S98112A Complete traumatic amputation of left great toe, initial encounter: Secondary | ICD-10-CM

## 2018-08-03 DIAGNOSIS — I87323 Chronic venous hypertension (idiopathic) with inflammation of bilateral lower extremity: Secondary | ICD-10-CM

## 2018-08-03 DIAGNOSIS — Z89412 Acquired absence of left great toe: Secondary | ICD-10-CM

## 2018-08-03 NOTE — Progress Notes (Signed)
Office Visit Note   Patient: Russell Merritt           Date of Birth: [birth year]           MRN: [id number] Visit Date: 08/03/2018              Requested by: Carlis Stable, PA-C [address] [place], [place] [address] PCP: Carlis Stable, [place]  Chief Complaint  Patient presents with  . Left Foot - Follow-up      HPI: Patient is a 52 year old gentleman who presents in follow-up he is 15 months status post left foot first ray amputation.  Patient states he still has difficulty with balance and stability he states he fell while mowing his yard.  Patient states that he is unable to stand on his foot for a prolonged period of time he states that his previous level of work was 10 hours a day standing in work boots.  Patient states of note he did notice a piece of wire in the orthotic where he developed an ulcer.  Patient was concerned that this should be a Workmen's Comp. injury secondary to the wire from work penetrating his shoe.  Assessment & Plan: Visit Diagnoses:  1. Amputated great toe, left (HCC)   2. Idiopathic chronic venous hypertension of both lower extremities with inflammation     Plan: Recommended using a cane in the opposite hand until he regains his strength and balance.  Recommended continue with knee-high medical compression stockings.  Discussed the patient is not safe to return to work standing on his feet 10 hours a day.  Follow-Up Instructions: Return in about 2 months (around 10/03/2018).   Ortho Exam  Patient is alert, oriented, no adenopathy, well-dressed, normal affect, normal respiratory effort. Examination patient has decreased swelling in the left lower extremity he has brawny skin color changes from the foot to the leg but no ulcers no drainage he has minimal pitting edema.  The first ray amputation is well-healed he has no plantar ulcers his foot is plantigrade with neutral dorsiflexion.  Imaging: No results  found. No images are attached to the encounter.  Labs: Lab Results  Component Value Date   HGBA1C 7.6 03/08/2018   HGBA1C 13.4 (H) 05/07/2017   HGBA1C 13.9 (H) 05/06/2017   ESRSEDRATE 118 (H) 05/04/2017   REPTSTATUS 05/10/2017 FINAL 05/06/2017   REPTSTATUS 05/09/2017 FINAL 05/06/2017   GRAMSTAIN  05/06/2017    RARE WBC PRESENT, PREDOMINANTLY PMN FEW GRAM POSITIVE COCCI IN PAIRS RARE GRAM NEGATIVE COCCOBACILLI    CULT  05/06/2017    ABUNDANT PREVOTELLA BIVIA BETA LACTAMASE NEGATIVE Performed at [facility], [address] [place], [place] [address]    CULT  05/06/2017    FEW GROUP B STREP(S.AGALACTIAE)ISOLATED TESTING AGAINST S. AGALACTIAE NOT ROUTINELY PERFORMED DUE TO PREDICTABILITY OF AMP/PEN/VAN SUSCEPTIBILITY. Performed at [facility], [address], [place], [place] [address]    LABORGA STAPHYLOCOCCUS AUREUS 05/05/2017     No results found for: ALBUMIN, PREALBUMIN, LABURIC  There is no height or weight on file to calculate BMI.  Orders:  No orders of the defined types were placed in this encounter.  No orders of the defined types were placed in this encounter.    Procedures: No procedures performed  Clinical Data: No additional findings.  ROS:  All other systems negative, except as noted in the HPI. Review of Systems  Objective: Vital Signs: There were no vitals taken for this  visit.  Specialty Comments:  No specialty comments available.  PMFS History: Patient Active Problem List   Diagnosis Date Noted  . Pruritus 03/09/2018  . Diabetic peripheral neuropathy associated with type 2 diabetes mellitus (HCC) 03/09/2018  . Onychomycosis of multiple toenails with type 2 diabetes mellitus (HCC) 03/09/2018  . Hypertension associated with diabetes (HCC) 03/09/2018  . Chronic periscapular pain on right side 03/09/2018  . Diabetic eye exam (HCC) 03/09/2018  . Class 3 severe obesity due to excess calories without serious comorbidity with  body mass index (BMI) of 40.0 to 44.9 in adult (HCC) 03/09/2018  . Positive depression screening 03/09/2018  . Nicotine dependence 03/09/2018  . Venous stasis 03/09/2018  . Idiopathic chronic venous hypertension of both lower extremities with inflammation 07/27/2017  . Amputated great toe, left (HCC) 05/29/2017  . Cellulitis in diabetic foot (HCC) 05/05/2017  . Osteomyelitis (HCC) 05/05/2017  . Newly diagnosed diabetes (HCC) 05/05/2017  . Hyponatremia 05/05/2017  . Uncontrolled type 2 diabetes mellitus with complication, without long-term current use of insulin (HCC)   . Diabetic foot ulcer (HCC) 05/04/2017   Past Medical History:  Diagnosis Date  . 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 Hypertension   . Neuromuscular disorder (HCC)     Family History  Problem Relation Age of Onset  . Diabetes Mother   . Heart attack Father   . Diabetes Maternal Grandmother     Past Surgical History:  Procedure Laterality Date  . AMPUTATION Left 05/06/2017   Procedure: AMPUTATION GREAT TOE;  Surgeon: Beverely Low, MD;  Location: [facility];  Service: Orthopedics;  Laterality: Left;  . AMPUTATION Left 05/08/2017   Procedure: Left 1st Ray Amputation;  Surgeon: Nadara Mustard, MD;  Location: [facility] OR;  Service: Orthopedics;  Laterality: Left;  . HAND SURGERY Left   . TONSILLECTOMY     Social History   Occupational History  . Not on file  Tobacco Use  . Smoking status: Former Smoker    Types: Cigarettes    Last attempt to quit: 03/28/2013    Years since quitting: 5.3  . Smokeless tobacco: Never Used  Substance and Sexual Activity  . Alcohol use: No  . Drug use: No  . Sexual activity: Not Currently

## 2018-08-09 ENCOUNTER — Ambulatory Visit: Payer: 59 | Admitting: Podiatry

## 2018-10-04 ENCOUNTER — Ambulatory Visit (INDEPENDENT_AMBULATORY_CARE_PROVIDER_SITE_OTHER): Payer: Self-pay | Admitting: Orthopedic Surgery

## 2018-10-04 ENCOUNTER — Encounter (INDEPENDENT_AMBULATORY_CARE_PROVIDER_SITE_OTHER): Payer: Self-pay | Admitting: Orthopedic Surgery

## 2018-10-04 VITALS — Ht 72.0 in | Wt 299.0 lb

## 2018-10-04 DIAGNOSIS — M6702 Short Achilles tendon (acquired), left ankle: Secondary | ICD-10-CM

## 2018-10-04 DIAGNOSIS — Z89412 Acquired absence of left great toe: Secondary | ICD-10-CM

## 2018-10-04 DIAGNOSIS — I87323 Chronic venous hypertension (idiopathic) with inflammation of bilateral lower extremity: Secondary | ICD-10-CM

## 2018-10-04 DIAGNOSIS — S98112A Complete traumatic amputation of left great toe, initial encounter: Secondary | ICD-10-CM

## 2018-10-04 NOTE — Progress Notes (Signed)
Office Visit Note   Patient: Russell Merritt           Date of Birth: Dec 01, 1966           MRN: [id number] Visit Date: 10/04/2018              Requested by: Carlis Stable, PA-C [address] [place], [place] [address] PCP: Carlis Stable, [place]  Chief Complaint  Patient presents with  . Left Foot - Pain, Follow-up      HPI: Patient is a 52 year old gentleman who presents status post left foot first ray amputation he still has pain beneath the forefoot primarily the second metatarsal head.  He states he took Aleve this morning.  He is wearing knee-high compression stockings he denies any swelling.  He states the pain is primarily beneath the second and third metatarsal heads as well as dorsal over the PIP joint of the second toe he has new extra-depth shoes with a stiff sole.  Assessment & Plan: Visit Diagnoses:  1. Amputated great toe, left (HCC)   2. Idiopathic chronic venous hypertension of both lower extremities with inflammation   3. Achilles tendon contracture, left     Plan: Again the importance of Achilles stretching was discussed this was demonstrated to him.  Discussed the stretching must be performed with the knee extended to stretch the gastrocnemius muscles.  Recommended 5 times a day a minute at a time.  Reevaluate after the first of the year.  Follow-Up Instructions: Return in about 2 months (around 12/04/2018).   Ortho Exam  Patient is alert, oriented, no adenopathy, well-dressed, normal affect, normal respiratory effort. Examination patient still has heel cord tightness he has dorsiflexion about 10 to 20 degrees short of neutral with his knee extended.  With his knee flexed he has good dorsiflexion of the ankle.  He has brawny skin color changes but no venous ulcers.  He has clawing of the toes and is tender to palpation of the PIP joint the second toe but no open ulcer.  There are no plantar ulcers no calluses.  He has a good  pulse.  Imaging: No results found. No images are attached to the encounter.  Labs: Lab Results  Component Value Date   HGBA1C 7.6 03/08/2018   HGBA1C 13.4 (H) 05/07/2017   HGBA1C 13.9 (H) 05/06/2017   ESRSEDRATE 118 (H) 05/04/2017   REPTSTATUS 05/10/2017 FINAL 05/06/2017   REPTSTATUS 05/09/2017 FINAL 05/06/2017   GRAMSTAIN  05/06/2017    RARE WBC PRESENT, PREDOMINANTLY PMN FEW GRAM POSITIVE COCCI IN PAIRS RARE GRAM NEGATIVE COCCOBACILLI    CULT  05/06/2017    ABUNDANT PREVOTELLA BIVIA BETA LACTAMASE NEGATIVE Performed at [facility], [address], [place], [place] [address]    CULT  05/06/2017    FEW GROUP B STREP(S.AGALACTIAE)ISOLATED TESTING AGAINST S. AGALACTIAE NOT ROUTINELY PERFORMED DUE TO PREDICTABILITY OF AMP/PEN/VAN SUSCEPTIBILITY. Performed at [facility], [address], [place], [place] [address]    LABORGA STAPHYLOCOCCUS AUREUS 05/05/2017     No results found for: ALBUMIN, PREALBUMIN, LABURIC  Body mass index is 40.55 kg/m.  Orders:  No orders of the defined types were placed in this encounter.  No orders of the defined types were placed in this encounter.    Procedures: No procedures performed  Clinical Data: No additional findings.  ROS:  All other systems negative, except as noted in the HPI. Review of Systems  Objective: Vital Signs: Ht 6' (  1.829 m)   Wt 299 lb (135.6 kg)   BMI 40.55 kg/m   Specialty Comments:  No specialty comments available.  PMFS History: Patient Active Problem List   Diagnosis Date Noted  . Pruritus 03/09/2018  . Diabetic peripheral neuropathy associated with type 2 diabetes mellitus (HCC) 03/09/2018  . Onychomycosis of multiple toenails with type 2 diabetes mellitus (HCC) 03/09/2018  . Hypertension associated with diabetes (HCC) 03/09/2018  . Chronic periscapular pain on right side 03/09/2018  . Diabetic eye exam (HCC) 03/09/2018  . Class 3 severe obesity due to excess calories without  serious comorbidity with body mass index (BMI) of 40.0 to 44.9 in adult (HCC) 03/09/2018  . Positive depression screening 03/09/2018  . Nicotine dependence 03/09/2018  . Venous stasis 03/09/2018  . Idiopathic chronic venous hypertension of both lower extremities with inflammation 07/27/2017  . Amputated great toe, left (HCC) 05/29/2017  . Cellulitis in diabetic foot (HCC) 05/05/2017  . Osteomyelitis (HCC) 05/05/2017  . Newly diagnosed diabetes (HCC) 05/05/2017  . Hyponatremia 05/05/2017  . Uncontrolled type 2 diabetes mellitus with complication, without long-term current use of insulin (HCC)   . Diabetic foot ulcer (HCC) 05/04/2017   Past Medical History:  Diagnosis Date  . 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 Hypertension   . Neuromuscular disorder (HCC)     Family History  Problem Relation Age of Onset  . Diabetes Mother   . Heart attack Father   . Diabetes Maternal Grandmother     Past Surgical History:  Procedure Laterality Date  . AMPUTATION Left 05/06/2017   Procedure: AMPUTATION GREAT TOE;  Surgeon: Beverely Low, MD;  Location: [facility];  Service: Orthopedics;  Laterality: Left;  . AMPUTATION Left 05/08/2017   Procedure: Left 1st Ray Amputation;  Surgeon: Nadara Mustard, MD;  Location: [facility] OR;  Service: Orthopedics;  Laterality: Left;  . HAND SURGERY Left   . TONSILLECTOMY     Social History   Occupational History  . Not on file  Tobacco Use  . Smoking status: Former Smoker    Types: Cigarettes    Last attempt to quit: 03/28/2013    Years since quitting: 5.5  . Smokeless tobacco: Never Used  Substance and Sexual Activity  . Alcohol use: No  . Drug use: No  . Sexual activity: Not Currently

## 2018-12-06 ENCOUNTER — Encounter (INDEPENDENT_AMBULATORY_CARE_PROVIDER_SITE_OTHER): Payer: Self-pay | Admitting: Orthopedic Surgery

## 2018-12-06 ENCOUNTER — Ambulatory Visit (INDEPENDENT_AMBULATORY_CARE_PROVIDER_SITE_OTHER): Payer: Self-pay | Admitting: Physician Assistant

## 2018-12-06 VITALS — Ht 72.0 in | Wt 299.0 lb

## 2018-12-06 DIAGNOSIS — I87323 Chronic venous hypertension (idiopathic) with inflammation of bilateral lower extremity: Secondary | ICD-10-CM

## 2018-12-06 DIAGNOSIS — Z89412 Acquired absence of left great toe: Secondary | ICD-10-CM

## 2018-12-06 DIAGNOSIS — M6702 Short Achilles tendon (acquired), left ankle: Secondary | ICD-10-CM

## 2018-12-06 DIAGNOSIS — E1142 Type 2 diabetes mellitus with diabetic polyneuropathy: Secondary | ICD-10-CM

## 2018-12-06 DIAGNOSIS — S98112A Complete traumatic amputation of left great toe, initial encounter: Secondary | ICD-10-CM

## 2018-12-07 ENCOUNTER — Encounter (INDEPENDENT_AMBULATORY_CARE_PROVIDER_SITE_OTHER): Payer: Self-pay | Admitting: Physician Assistant

## 2018-12-07 NOTE — Progress Notes (Signed)
Office Visit Note   Patient: Russell Merritt           Date of Birth: [birth year]           MRN: [id number] Visit Date: 12/06/2018              Requested by: Carlis Stable, PA-C [address] [place], [place] [address] PCP: Carlis Stable, [place]  Chief Complaint  Patient presents with  . Left Foot - Follow-up    Left foot 1st ray amputation 05/08/17      HPI: The patient is a 53 year old gentleman who is seen for follow-up following a left first ray amputation back in June 2018.  He has been working on Achilles stretching as he had a significant Achilles contracture on the left side and reports that he feels like stretching is helping some.  He continues to have persistent phantom pain over the left great toe.  He reports that he took gabapentin in the past but this did not work well and he has since discontinued.  He has been using over-the-counter insoles for his shoes and he says this helps some with the pain that he has had.  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  Assessment & Plan: Visit Diagnoses:  1. Amputated great toe, left (HCC)   2. Type 2 diabetes mellitus with diabetic polyneuropathy, without long-term current use of insulin (HCC)   3. Idiopathic chronic venous hypertension of both lower extremities with inflammation   4. Achilles tendon contracture, left     Plan: Recommend continued work with Achilles stretching daily.  Continue wearing the Vive compression socks daily.  Continue daily foot survey to monitor for any calluses or ulcers and return for the development of any of these issues.  He will otherwise follow-up on an as-needed basis.  Follow-Up Instructions: Return if symptoms worsen or fail to improve.   Ortho Exam  Patient is alert, oriented, no adenopathy,  well-dressed, normal affect, normal respiratory effort. The left great toe amputation site is well-healed.  Left foot range of motion and ankle range of motion improved slightly with Achilles stretching with 5 degrees of dorsiflexion.  He has no ankle edema or foot edema.  Bilateral feet are observed and have no calluses or ulcers.  He has good pedal pulses bilaterally.  Imaging: No results found. No images are attached to the encounter.  Labs: Lab Results  Component Value Date   HGBA1C 7.6 03/08/2018   HGBA1C 13.4 (H) 05/07/2017   HGBA1C 13.9 (H) 05/06/2017   ESRSEDRATE 118 (H) 05/04/2017   REPTSTATUS 05/10/2017 FINAL 05/06/2017   REPTSTATUS 05/09/2017 FINAL 05/06/2017   GRAMSTAIN  05/06/2017    RARE WBC PRESENT, PREDOMINANTLY PMN FEW GRAM POSITIVE COCCI IN PAIRS RARE GRAM NEGATIVE COCCOBACILLI    CULT  05/06/2017    ABUNDANT PREVOTELLA BIVIA BETA LACTAMASE NEGATIVE Performed at [facility], [address] [place], [place] [address]    CULT  05/06/2017    FEW GROUP B STREP(S.AGALACTIAE)ISOLATED TESTING AGAINST S. AGALACTIAE NOT ROUTINELY PERFORMED DUE TO PREDICTABILITY OF AMP/PEN/VAN SUSCEPTIBILITY. Performed at [facility], [address] [place], [place] [address]    LABORGA STAPHYLOCOCCUS AUREUS 05/05/2017     No results found for: ALBUMIN, PREALBUMIN, LABURIC  Body mass index is 40.55 kg/m.  Orders:  No orders of the defined types were placed in this encounter.  No orders of the defined types were placed in this encounter.    Procedures: No procedures performed  Clinical Data: No additional findings.  ROS:  All other systems negative, except as noted in the HPI. Review of Systems  Objective: Vital Signs: Ht 6' (1.829 m)   Wt 299 lb (135.6 kg)   BMI 40.55 kg/m   Specialty Comments:  No specialty comments available.  PMFS History: Patient Active Problem List   Diagnosis Date Noted  . Pruritus 03/09/2018  . Diabetic peripheral  neuropathy associated with type 2 diabetes mellitus (HCC) 03/09/2018  . Onychomycosis of multiple toenails with type 2 diabetes mellitus (HCC) 03/09/2018  . Hypertension associated with diabetes (HCC) 03/09/2018  . Chronic periscapular pain on right side 03/09/2018  . Diabetic eye exam (HCC) 03/09/2018  . Class 3 severe obesity due to excess calories without serious comorbidity with body mass index (BMI) of 40.0 to 44.9 in adult (HCC) 03/09/2018  . Positive depression screening 03/09/2018  . Nicotine dependence 03/09/2018  . Venous stasis 03/09/2018  . Idiopathic chronic venous hypertension of both lower extremities with inflammation 07/27/2017  . Amputated great toe, left (HCC) 05/29/2017  . Cellulitis in diabetic foot (HCC) 05/05/2017  . Osteomyelitis (HCC) 05/05/2017  . Newly diagnosed diabetes (HCC) 05/05/2017  . Hyponatremia 05/05/2017  . Uncontrolled type 2 diabetes mellitus with complication, without long-term current use of insulin (HCC)   . Diabetic foot ulcer (HCC) 05/04/2017   Past Medical History:  Diagnosis Date  . 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 Hypertension   . Neuromuscular disorder (HCC)     Family History  Problem Relation Age of Onset  . Diabetes Mother   . Heart attack Father   . Diabetes Maternal Grandmother     Past Surgical History:  Procedure Laterality Date  . AMPUTATION Left 05/06/2017   Procedure: AMPUTATION GREAT TOE;  Surgeon: Beverely LowNorris, Steve, MD;  Location: [facility];  Service: Orthopedics;  Laterality: Left;  . AMPUTATION Left 05/08/2017   Procedure: Left 1st Ray Amputation;  Surgeon: Nadara Mustarduda, Marcus V, MD;  [facility] OR;  Service: Orthopedics;  Laterality: Left;  . HAND SURGERY Left   . TONSILLECTOMY     Social History   Occupational History  . Not on file  Tobacco Use  . Smoking status: Former Smoker    Types: Cigarettes    Last attempt to quit: 03/28/2013    Years since quitting: 5.6    . Smokeless tobacco: Never Used  Substance and Sexual Activity  . Alcohol use: No  . Drug use: No  . Sexual activity: Not Currently

## 2019-01-11 IMAGING — DX DG CERVICAL SPINE COMPLETE 4+V
6 series · 6 of 6 positions shown · non-contrast
Comparison: None.

CLINICAL DATA: Chronic periscapular pain on right side.

EXAM:
CERVICAL SPINE - COMPLETE 4+ VIEW

[c-spine lat]
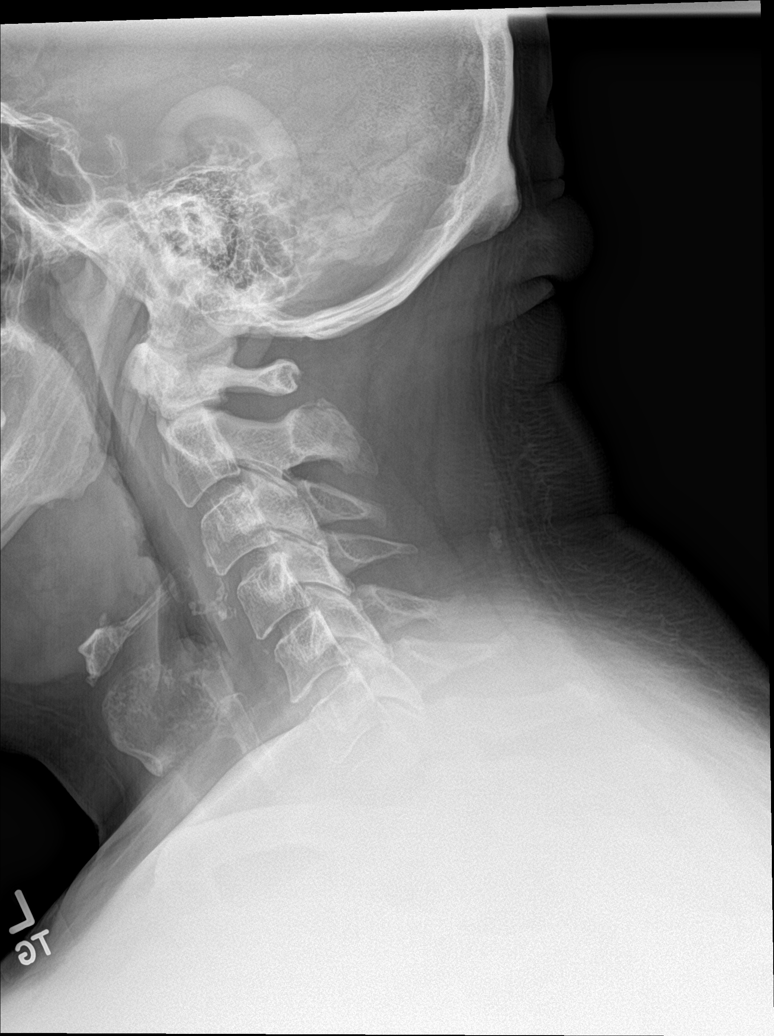

[c-spine obl (1 of 2)]
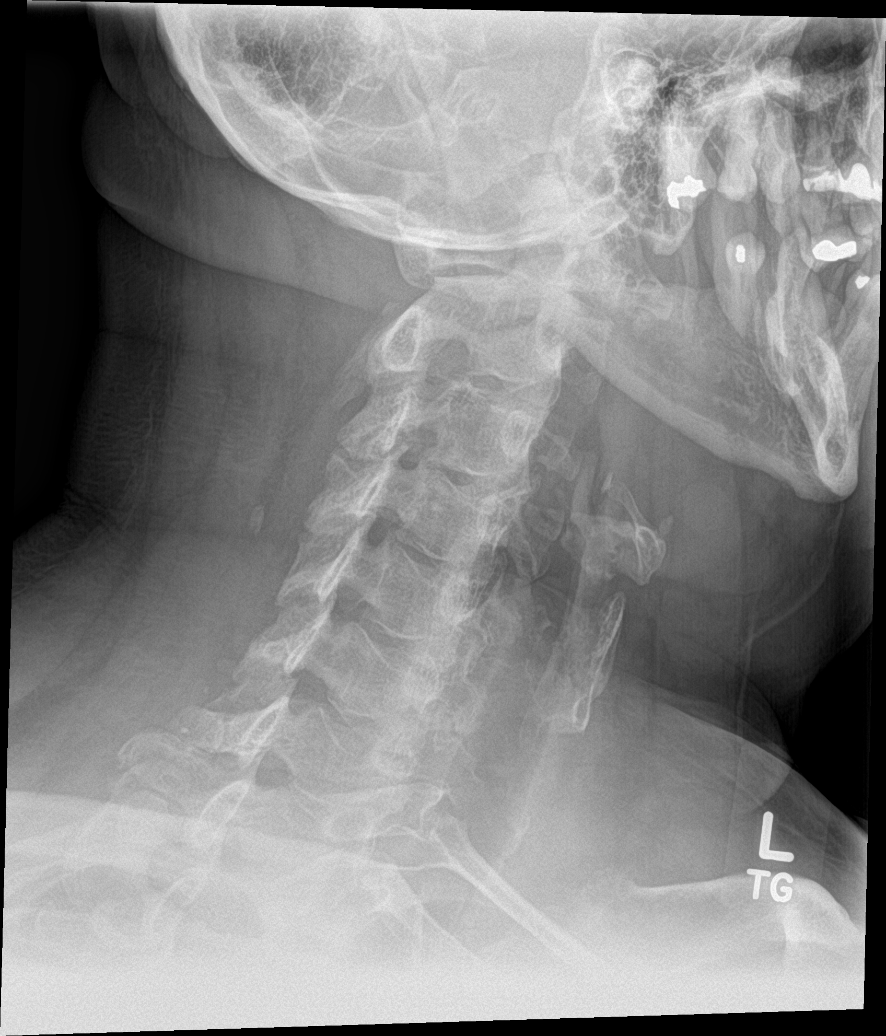

[c-spine obl (2 of 2)]
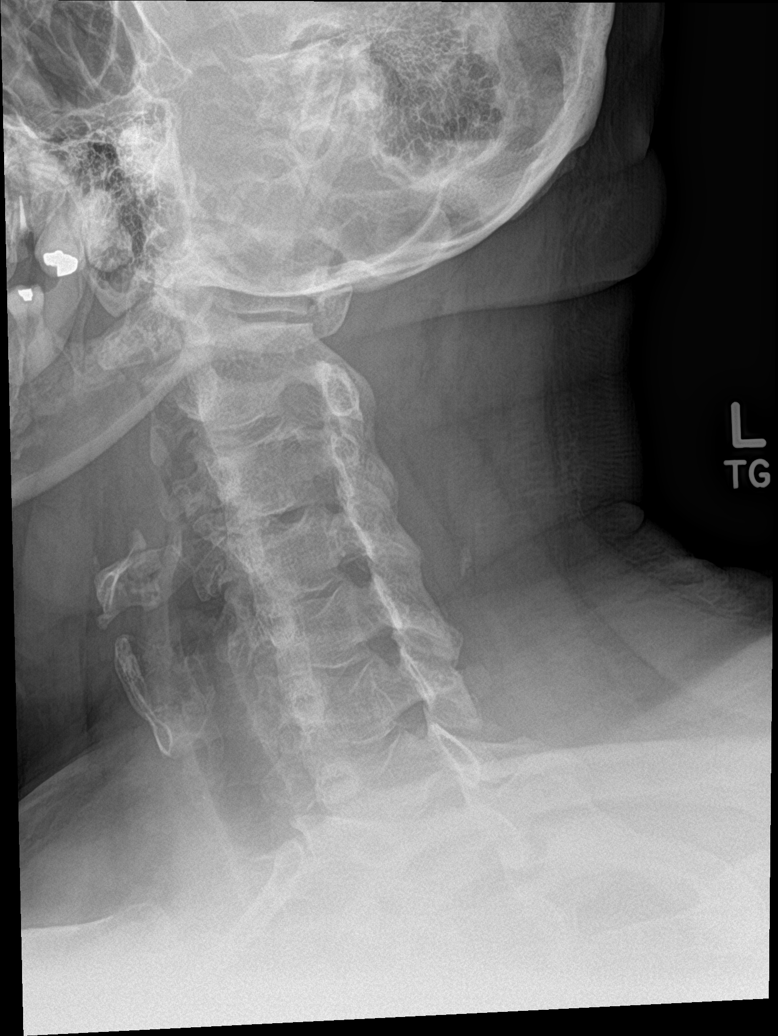

[c-spine ap]
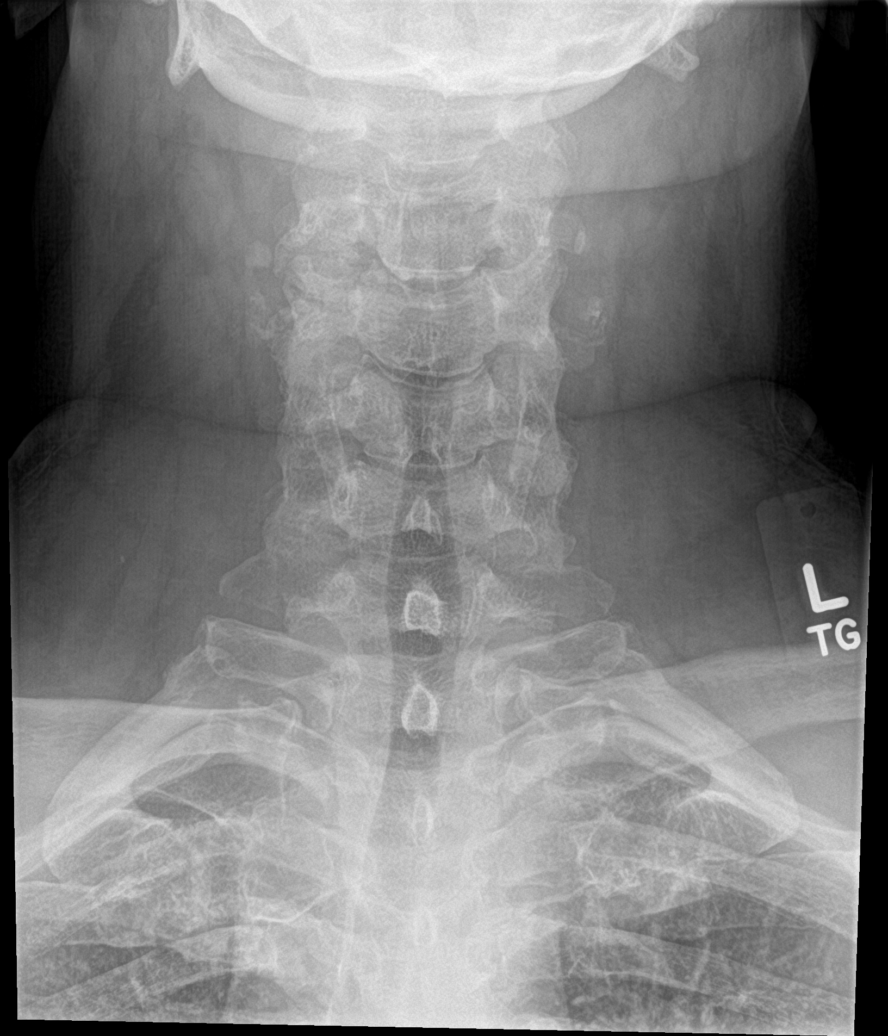

[c-spine open mouth]
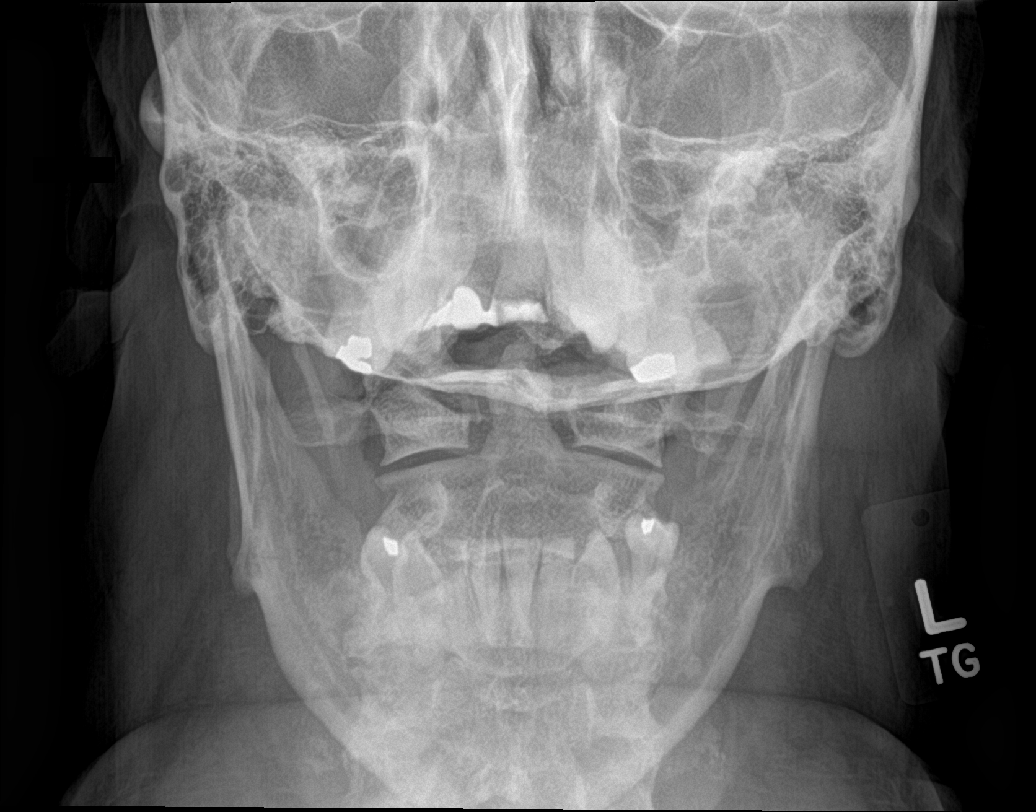

[c-spine swimmers]
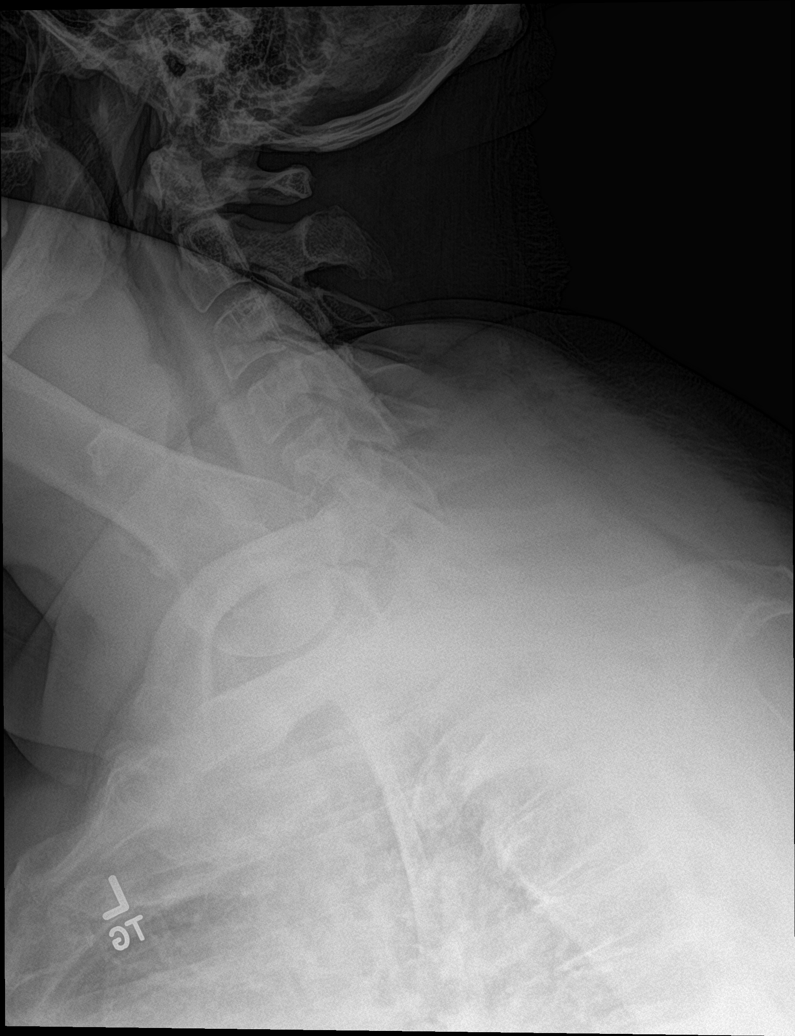

[6 of 6 positions shown; findings below may reference images not displayed]

FINDINGS: There is no evidence of cervical spine fracture or prevertebral soft
tissue swelling. Alignment is normal. No other significant bone
abnormalities are identified. No significant neural foraminal
stenosis is noted.
IMPRESSION: Negative cervical spine radiographs.

## 2019-07-15 MED ORDER — GENERIC EXTERNAL MEDICATION
Status: DC
Start: ? — End: 2019-07-15

## 2019-07-15 MED ORDER — INSULIN NPH (HUMAN) (ISOPHANE) 100 UNIT/ML ~~LOC~~ SUSP
1.00 | SUBCUTANEOUS | Status: DC
Start: 2019-07-14 — End: 2019-07-15

## 2019-07-15 MED ORDER — AMLODIPINE BESYLATE 5 MG PO TABS
5.00 | ORAL_TABLET | ORAL | Status: DC
Start: 2019-07-15 — End: 2019-07-15

## 2019-07-15 MED ORDER — ENOXAPARIN SODIUM 40 MG/0.4ML ~~LOC~~ SOLN
40.00 | SUBCUTANEOUS | Status: DC
Start: 2019-07-15 — End: 2019-07-15

## 2019-07-15 MED ORDER — SODIUM CHLORIDE 0.9 % IV SOLN
10.00 | INTRAVENOUS | Status: DC
Start: ? — End: 2019-07-15

## 2019-07-15 MED ORDER — THERA PO TABS
1.00 | ORAL_TABLET | ORAL | Status: DC
Start: 2019-07-15 — End: 2019-07-15

## 2019-07-15 MED ORDER — NITROGLYCERIN 0.4 MG SL SUBL
.40 | SUBLINGUAL_TABLET | SUBLINGUAL | Status: DC
Start: ? — End: 2019-07-15

## 2019-07-15 MED ORDER — ACETAMINOPHEN 325 MG PO TABS
650.00 | ORAL_TABLET | ORAL | Status: DC
Start: ? — End: 2019-07-15

## 2019-07-15 MED ORDER — GENERIC EXTERNAL MEDICATION
1.75 | Status: DC
Start: 2019-07-14 — End: 2019-07-15

## 2019-07-15 MED ORDER — POLYETHYLENE GLYCOL 3350 17 G PO PACK
17.00 | PACK | ORAL | Status: DC
Start: ? — End: 2019-07-15

## 2019-07-15 MED ORDER — LISINOPRIL 5 MG PO TABS
5.00 | ORAL_TABLET | ORAL | Status: DC
Start: 2019-07-15 — End: 2019-07-15

## 2019-07-15 MED ORDER — INSULIN LISPRO 100 UNIT/ML ~~LOC~~ SOLN
1.00 | SUBCUTANEOUS | Status: DC
Start: 2019-07-14 — End: 2019-07-15

## 2019-07-15 MED ORDER — GENERIC EXTERNAL MEDICATION
2.00 | Status: DC
Start: 2019-07-14 — End: 2019-07-15

## 2019-07-15 MED ORDER — HYDRALAZINE HCL 20 MG/ML IJ SOLN
10.00 | INTRAMUSCULAR | Status: DC
Start: ? — End: 2019-07-15

## 2019-07-15 MED ORDER — BENZONATATE 100 MG PO CAPS
100.00 | ORAL_CAPSULE | ORAL | Status: DC
Start: ? — End: 2019-07-15

## 2019-07-15 MED ORDER — HYDROCODONE-ACETAMINOPHEN 5-325 MG PO TABS
1.00 | ORAL_TABLET | ORAL | Status: DC
Start: ? — End: 2019-07-15

## 2019-07-15 MED ORDER — ALUM & MAG HYDROXIDE-SIMETH 200-200-20 MG/5ML PO SUSP
30.00 | ORAL | Status: DC
Start: ? — End: 2019-07-15

## 2019-07-15 MED ORDER — INSULIN REGULAR HUMAN 100 UNIT/ML IJ SOLN
.00 | INTRAMUSCULAR | Status: DC
Start: ? — End: 2019-07-15

## 2019-07-15 MED ORDER — NICOTINE 21 MG/24HR TD PT24
1.00 | MEDICATED_PATCH | TRANSDERMAL | Status: DC
Start: ? — End: 2019-07-15

## 2019-07-15 MED ORDER — INSULIN NPH (HUMAN) (ISOPHANE) 100 UNIT/ML ~~LOC~~ SUSP
1.00 | SUBCUTANEOUS | Status: DC
Start: ? — End: 2019-07-15

## 2019-07-15 MED ORDER — ASPIRIN 325 MG PO TABS
325.00 | ORAL_TABLET | ORAL | Status: DC
Start: 2019-07-15 — End: 2019-07-15

## 2019-07-15 MED ORDER — INSULIN LISPRO 100 UNIT/ML ~~LOC~~ SOLN
1.00 | SUBCUTANEOUS | Status: DC
Start: ? — End: 2019-07-15

## 2019-12-29 MED ORDER — INSULIN GLARGINE 100 UNIT/ML ~~LOC~~ SOLN
10.00 | SUBCUTANEOUS | Status: DC
Start: 2019-12-28 — End: 2019-12-29

## 2019-12-29 MED ORDER — GENERIC EXTERNAL MEDICATION
Status: DC
Start: ? — End: 2019-12-29

## 2019-12-29 MED ORDER — SODIUM CHLORIDE 0.9 % IV SOLN
10.00 | INTRAVENOUS | Status: DC
Start: ? — End: 2019-12-29

## 2019-12-29 MED ORDER — GENERIC EXTERNAL MEDICATION
3.38 | Status: DC
Start: 2019-12-28 — End: 2019-12-29

## 2019-12-29 MED ORDER — POTASSIUM CHLORIDE IN NACL 20-0.9 MEQ/L-% IV SOLN
75.00 | INTRAVENOUS | Status: DC
Start: ? — End: 2019-12-29

## 2019-12-29 MED ORDER — ENOXAPARIN SODIUM 40 MG/0.4ML ~~LOC~~ SOLN
40.00 | SUBCUTANEOUS | Status: DC
Start: 2019-12-29 — End: 2019-12-29

## 2019-12-29 MED ORDER — LISINOPRIL 10 MG PO TABS
10.00 | ORAL_TABLET | ORAL | Status: DC
Start: 2019-12-29 — End: 2019-12-29

## 2019-12-29 MED ORDER — PANTOPRAZOLE SODIUM 40 MG IV SOLR
40.00 | INTRAVENOUS | Status: DC
Start: 2019-12-29 — End: 2019-12-29

## 2019-12-29 MED ORDER — HYDROMORPHONE HCL 1 MG/ML IJ SOLN
0.50 | INTRAMUSCULAR | Status: DC
Start: ? — End: 2019-12-29

## 2019-12-29 MED ORDER — INSULIN LISPRO 100 UNIT/ML ~~LOC~~ SOLN
1.00 | SUBCUTANEOUS | Status: DC
Start: 2019-12-28 — End: 2019-12-29

## 2019-12-29 MED ORDER — METFORMIN HCL 500 MG PO TABS
500.00 | ORAL_TABLET | ORAL | Status: DC
Start: 2019-12-28 — End: 2019-12-29

## 2019-12-29 MED ORDER — ASPIRIN 325 MG PO TABS
325.00 | ORAL_TABLET | ORAL | Status: DC
Start: 2019-12-29 — End: 2019-12-29

## 2020-10-01 DEATH — deceased
